# Patient Record
Sex: Female | Born: 1935 | Race: White | Hispanic: No | Marital: Single | Smoking: Former smoker
Health system: Southern US, Community
[De-identification: ages and names within clinical notes are randomized; demographics above are authoritative.]

## PROBLEM LIST (undated history)

## (undated) DIAGNOSIS — I498 Other specified cardiac arrhythmias: Secondary | ICD-10-CM

## (undated) DIAGNOSIS — K859 Acute pancreatitis without necrosis or infection, unspecified: Secondary | ICD-10-CM

## (undated) DIAGNOSIS — H3530 Unspecified macular degeneration: Secondary | ICD-10-CM

## (undated) DIAGNOSIS — H9190 Unspecified hearing loss, unspecified ear: Secondary | ICD-10-CM

## (undated) DIAGNOSIS — I10 Essential (primary) hypertension: Secondary | ICD-10-CM

## (undated) DIAGNOSIS — C801 Malignant (primary) neoplasm, unspecified: Secondary | ICD-10-CM

## (undated) HISTORY — PX: BREAST SURGERY: SHX581

## (undated) HISTORY — PX: TONSILLECTOMY: SUR1361

## (undated) HISTORY — DX: Other specified cardiac arrhythmias: I49.8

## (undated) HISTORY — PX: CHOLECYSTECTOMY: SHX55

---

## 2001-03-07 ENCOUNTER — Encounter: Payer: Self-pay | Admitting: Internal Medicine

## 2001-03-07 ENCOUNTER — Ambulatory Visit (HOSPITAL_COMMUNITY): Admission: RE | Admit: 2001-03-07 | Discharge: 2001-03-07 | Payer: Self-pay | Admitting: Internal Medicine

## 2001-03-11 ENCOUNTER — Encounter: Payer: Self-pay | Admitting: Internal Medicine

## 2001-03-11 ENCOUNTER — Ambulatory Visit (HOSPITAL_COMMUNITY): Admission: RE | Admit: 2001-03-11 | Discharge: 2001-03-11 | Payer: Self-pay | Admitting: Internal Medicine

## 2001-05-06 ENCOUNTER — Ambulatory Visit (HOSPITAL_COMMUNITY): Admission: RE | Admit: 2001-05-06 | Discharge: 2001-05-06 | Payer: Self-pay | Admitting: Internal Medicine

## 2001-05-06 ENCOUNTER — Encounter: Payer: Self-pay | Admitting: Internal Medicine

## 2001-12-16 ENCOUNTER — Ambulatory Visit (HOSPITAL_COMMUNITY): Admission: RE | Admit: 2001-12-16 | Discharge: 2001-12-16 | Payer: Self-pay | Admitting: Internal Medicine

## 2002-03-17 ENCOUNTER — Encounter: Payer: Self-pay | Admitting: Internal Medicine

## 2002-03-17 ENCOUNTER — Ambulatory Visit (HOSPITAL_COMMUNITY): Admission: RE | Admit: 2002-03-17 | Discharge: 2002-03-17 | Payer: Self-pay | Admitting: Internal Medicine

## 2002-03-27 ENCOUNTER — Ambulatory Visit (HOSPITAL_COMMUNITY): Admission: RE | Admit: 2002-03-27 | Discharge: 2002-03-27 | Payer: Self-pay | Admitting: Internal Medicine

## 2002-03-27 ENCOUNTER — Encounter: Payer: Self-pay | Admitting: Internal Medicine

## 2002-03-31 ENCOUNTER — Encounter: Payer: Self-pay | Admitting: General Surgery

## 2002-04-03 ENCOUNTER — Ambulatory Visit (HOSPITAL_COMMUNITY): Admission: RE | Admit: 2002-04-03 | Discharge: 2002-04-03 | Payer: Self-pay | Admitting: General Surgery

## 2002-04-03 ENCOUNTER — Encounter: Payer: Self-pay | Admitting: General Surgery

## 2002-04-10 ENCOUNTER — Inpatient Hospital Stay (HOSPITAL_COMMUNITY): Admission: RE | Admit: 2002-04-10 | Discharge: 2002-04-12 | Payer: Self-pay | Admitting: General Surgery

## 2002-05-09 ENCOUNTER — Encounter: Admission: RE | Admit: 2002-05-09 | Discharge: 2002-05-09 | Payer: Self-pay | Admitting: Oncology

## 2002-05-09 ENCOUNTER — Encounter (HOSPITAL_COMMUNITY): Admission: RE | Admit: 2002-05-09 | Discharge: 2002-06-08 | Payer: Self-pay | Admitting: Oncology

## 2002-05-15 ENCOUNTER — Ambulatory Visit (HOSPITAL_COMMUNITY): Admission: RE | Admit: 2002-05-15 | Discharge: 2002-05-15 | Payer: Self-pay | Admitting: Oncology

## 2002-05-15 ENCOUNTER — Encounter (HOSPITAL_COMMUNITY): Payer: Self-pay | Admitting: Oncology

## 2002-05-16 ENCOUNTER — Encounter (HOSPITAL_COMMUNITY): Payer: Self-pay | Admitting: Oncology

## 2002-06-21 ENCOUNTER — Ambulatory Visit (HOSPITAL_COMMUNITY): Admission: RE | Admit: 2002-06-21 | Discharge: 2002-06-21 | Payer: Self-pay | Admitting: Oncology

## 2002-06-21 ENCOUNTER — Encounter (HOSPITAL_COMMUNITY): Payer: Self-pay | Admitting: Oncology

## 2002-08-08 ENCOUNTER — Encounter (HOSPITAL_COMMUNITY): Admission: RE | Admit: 2002-08-08 | Discharge: 2002-09-07 | Payer: Self-pay | Admitting: Oncology

## 2002-08-08 ENCOUNTER — Encounter: Admission: RE | Admit: 2002-08-08 | Discharge: 2002-08-08 | Payer: Self-pay | Admitting: Oncology

## 2002-08-10 ENCOUNTER — Encounter (HOSPITAL_COMMUNITY): Payer: Self-pay | Admitting: Oncology

## 2002-11-21 ENCOUNTER — Encounter: Admission: RE | Admit: 2002-11-21 | Discharge: 2002-11-21 | Payer: Self-pay | Admitting: Oncology

## 2002-11-21 ENCOUNTER — Encounter (HOSPITAL_COMMUNITY): Admission: RE | Admit: 2002-11-21 | Discharge: 2002-12-21 | Payer: Self-pay | Admitting: Oncology

## 2003-04-11 ENCOUNTER — Encounter (HOSPITAL_COMMUNITY): Payer: Self-pay | Admitting: Oncology

## 2003-04-11 ENCOUNTER — Encounter (HOSPITAL_COMMUNITY): Admission: RE | Admit: 2003-04-11 | Discharge: 2003-05-11 | Payer: Self-pay | Admitting: Oncology

## 2003-04-11 ENCOUNTER — Encounter: Admission: RE | Admit: 2003-04-11 | Discharge: 2003-04-11 | Payer: Self-pay | Admitting: Oncology

## 2003-04-24 ENCOUNTER — Encounter: Payer: Self-pay | Admitting: Internal Medicine

## 2003-04-24 ENCOUNTER — Ambulatory Visit (HOSPITAL_COMMUNITY): Admission: RE | Admit: 2003-04-24 | Discharge: 2003-04-24 | Payer: Self-pay | Admitting: Internal Medicine

## 2003-06-08 ENCOUNTER — Encounter (HOSPITAL_COMMUNITY): Admission: RE | Admit: 2003-06-08 | Discharge: 2003-07-08 | Payer: Self-pay | Admitting: Oncology

## 2003-06-08 ENCOUNTER — Encounter: Admission: RE | Admit: 2003-06-08 | Discharge: 2003-06-08 | Payer: Self-pay | Admitting: Oncology

## 2003-12-10 ENCOUNTER — Encounter (HOSPITAL_COMMUNITY): Admission: RE | Admit: 2003-12-10 | Discharge: 2004-01-09 | Payer: Self-pay | Admitting: Oncology

## 2003-12-10 ENCOUNTER — Encounter: Admission: RE | Admit: 2003-12-10 | Discharge: 2003-12-10 | Payer: Self-pay | Admitting: Oncology

## 2004-05-06 ENCOUNTER — Ambulatory Visit (HOSPITAL_COMMUNITY): Admission: RE | Admit: 2004-05-06 | Discharge: 2004-05-06 | Payer: Self-pay | Admitting: Internal Medicine

## 2004-06-09 ENCOUNTER — Encounter: Admission: RE | Admit: 2004-06-09 | Discharge: 2004-06-09 | Payer: Self-pay | Admitting: Oncology

## 2004-06-09 ENCOUNTER — Encounter (HOSPITAL_COMMUNITY): Admission: RE | Admit: 2004-06-09 | Discharge: 2004-07-09 | Payer: Self-pay | Admitting: Oncology

## 2005-02-06 ENCOUNTER — Encounter (HOSPITAL_COMMUNITY): Admission: RE | Admit: 2005-02-06 | Discharge: 2005-03-08 | Payer: Self-pay | Admitting: Oncology

## 2005-02-06 ENCOUNTER — Ambulatory Visit (HOSPITAL_COMMUNITY): Payer: Self-pay | Admitting: Oncology

## 2005-02-06 ENCOUNTER — Encounter: Admission: RE | Admit: 2005-02-06 | Discharge: 2005-02-06 | Payer: Self-pay | Admitting: Oncology

## 2005-02-11 ENCOUNTER — Ambulatory Visit (HOSPITAL_COMMUNITY): Admission: RE | Admit: 2005-02-11 | Discharge: 2005-02-11 | Payer: Self-pay | Admitting: Internal Medicine

## 2005-05-04 ENCOUNTER — Ambulatory Visit (HOSPITAL_COMMUNITY): Admission: RE | Admit: 2005-05-04 | Discharge: 2005-05-04 | Payer: Self-pay | Admitting: Internal Medicine

## 2005-05-08 ENCOUNTER — Ambulatory Visit (HOSPITAL_COMMUNITY): Admission: RE | Admit: 2005-05-08 | Discharge: 2005-05-08 | Payer: Self-pay | Admitting: Internal Medicine

## 2005-10-07 ENCOUNTER — Ambulatory Visit (HOSPITAL_COMMUNITY): Payer: Self-pay | Admitting: Oncology

## 2005-10-07 ENCOUNTER — Encounter (HOSPITAL_COMMUNITY): Admission: RE | Admit: 2005-10-07 | Discharge: 2005-10-07 | Payer: Self-pay | Admitting: Oncology

## 2005-10-07 ENCOUNTER — Encounter: Admission: RE | Admit: 2005-10-07 | Discharge: 2005-10-07 | Payer: Self-pay | Admitting: Oncology

## 2006-04-09 ENCOUNTER — Encounter (HOSPITAL_COMMUNITY): Admission: RE | Admit: 2006-04-09 | Discharge: 2006-05-09 | Payer: Self-pay | Admitting: Oncology

## 2006-04-09 ENCOUNTER — Ambulatory Visit (HOSPITAL_COMMUNITY): Payer: Self-pay | Admitting: Oncology

## 2006-04-09 ENCOUNTER — Encounter: Admission: RE | Admit: 2006-04-09 | Discharge: 2006-04-09 | Payer: Self-pay | Admitting: Oncology

## 2006-05-11 ENCOUNTER — Ambulatory Visit (HOSPITAL_COMMUNITY): Admission: RE | Admit: 2006-05-11 | Discharge: 2006-05-11 | Payer: Self-pay | Admitting: Internal Medicine

## 2006-06-28 ENCOUNTER — Ambulatory Visit (HOSPITAL_COMMUNITY): Admission: RE | Admit: 2006-06-28 | Discharge: 2006-06-28 | Payer: Self-pay | Admitting: Ophthalmology

## 2006-09-13 ENCOUNTER — Ambulatory Visit (HOSPITAL_COMMUNITY): Admission: RE | Admit: 2006-09-13 | Discharge: 2006-09-13 | Payer: Self-pay | Admitting: Ophthalmology

## 2006-09-24 ENCOUNTER — Ambulatory Visit (HOSPITAL_COMMUNITY): Payer: Self-pay | Admitting: Oncology

## 2006-09-24 ENCOUNTER — Encounter (HOSPITAL_COMMUNITY): Admission: RE | Admit: 2006-09-24 | Discharge: 2006-10-11 | Payer: Self-pay | Admitting: Oncology

## 2007-02-25 ENCOUNTER — Ambulatory Visit (HOSPITAL_COMMUNITY): Payer: Self-pay | Admitting: Oncology

## 2007-02-25 ENCOUNTER — Encounter (HOSPITAL_COMMUNITY): Admission: RE | Admit: 2007-02-25 | Discharge: 2007-03-27 | Payer: Self-pay | Admitting: Oncology

## 2007-05-16 ENCOUNTER — Ambulatory Visit (HOSPITAL_COMMUNITY): Admission: RE | Admit: 2007-05-16 | Discharge: 2007-05-16 | Payer: Self-pay | Admitting: Internal Medicine

## 2008-02-22 ENCOUNTER — Encounter (HOSPITAL_COMMUNITY): Admission: RE | Admit: 2008-02-22 | Discharge: 2008-03-23 | Payer: Self-pay | Admitting: Oncology

## 2008-03-16 ENCOUNTER — Ambulatory Visit (HOSPITAL_COMMUNITY): Payer: Self-pay | Admitting: Oncology

## 2008-05-16 ENCOUNTER — Ambulatory Visit (HOSPITAL_COMMUNITY): Admission: RE | Admit: 2008-05-16 | Discharge: 2008-05-16 | Payer: Self-pay | Admitting: Internal Medicine

## 2008-10-31 ENCOUNTER — Ambulatory Visit (HOSPITAL_COMMUNITY): Admission: RE | Admit: 2008-10-31 | Discharge: 2008-10-31 | Payer: Self-pay | Admitting: Internal Medicine

## 2009-03-15 ENCOUNTER — Ambulatory Visit (HOSPITAL_COMMUNITY): Payer: Self-pay | Admitting: Oncology

## 2009-05-17 ENCOUNTER — Ambulatory Visit (HOSPITAL_COMMUNITY): Admission: RE | Admit: 2009-05-17 | Discharge: 2009-05-17 | Payer: Self-pay | Admitting: Internal Medicine

## 2010-03-14 ENCOUNTER — Ambulatory Visit (HOSPITAL_COMMUNITY): Payer: Self-pay | Admitting: Oncology

## 2010-05-03 ENCOUNTER — Encounter: Admission: RE | Admit: 2010-05-03 | Discharge: 2010-05-03 | Payer: Self-pay | Admitting: Internal Medicine

## 2010-05-20 ENCOUNTER — Ambulatory Visit (HOSPITAL_COMMUNITY): Admission: RE | Admit: 2010-05-20 | Discharge: 2010-05-20 | Payer: Self-pay | Admitting: Internal Medicine

## 2011-01-13 ENCOUNTER — Ambulatory Visit (HOSPITAL_BASED_OUTPATIENT_CLINIC_OR_DEPARTMENT_OTHER)
Admission: RE | Admit: 2011-01-13 | Discharge: 2011-01-13 | Disposition: A | Discharge status: Home / Self Care | Payer: Medicare FFS | Source: Ambulatory Visit | Attending: Orthopedic Surgery | Admitting: Orthopedic Surgery

## 2011-01-13 ENCOUNTER — Other Ambulatory Visit: Payer: Self-pay | Admitting: Orthopedic Surgery

## 2011-01-13 DIAGNOSIS — Z01812 Encounter for preprocedural laboratory examination: Secondary | ICD-10-CM | POA: Insufficient documentation

## 2011-01-13 DIAGNOSIS — M19049 Primary osteoarthritis, unspecified hand: Secondary | ICD-10-CM | POA: Insufficient documentation

## 2011-01-13 DIAGNOSIS — M6740 Ganglion, unspecified site: Secondary | ICD-10-CM | POA: Insufficient documentation

## 2011-01-13 LAB — POCT HEMOGLOBIN-HEMACUE: Hemoglobin: 14 g/dL (ref 12.0–15.0)

## 2011-02-27 NOTE — Op Note (Signed)
[facility]  Patient:    [name], [name] Visit Number: [id number] MRN: [id number]          Service Type: MED Location: 3A A310 01 Attending Physician:  Dalia Heading Dictated by:   Franky Macho, M.D. Proc. Date: 04/10/02 Admit Date:  04/10/2002   CC:         Carylon Perches, M.D.   Operative Report  PATIENT AGE:  77 years old.  PREOPERATIVE DIAGNOSIS:  Left breast carcinoma.  POSTOPERATIVE DIAGNOSIS:  Left breast carcinoma.  OPERATION:  Left modified radical mastectomy.  SURGEON:  Franky Macho, M.D.  ANESTHESIA:  General endotracheal anesthesia.  INDICATIONS:  The patient is an 77 year old white female who presents with invasive ductal carcinoma of the left breast.  The margins were not clear. After extensive discussion with the patient concerning all her options, she elected to proceed with a left modified radical mastectomy.  The risks and benefits of the procedure including bleeding, infection, and nerve injury were fully explained to the patient who gave informed consent.  DESCRIPTION OF PROCEDURE:  The patient was placed in the supine position.  The left breast and axilla were prepped and draped using the usual sterile technique with Betadine.  Surgical site confirmation was performed.  An elliptical incision was made medial and lateral around the nipple across the breast.  The superior and inferior flaps were then formed up to the left clavicle and to the chest wall.  The breast was then freed away along with its fascia from the pectoralis major muscle and chest wall using Bovie electrocautery medial to lateral.  Next, a level 2 axillary dissection was performed.  The long thoracic artery vein and nerve as well as long thoracic nerves were identified.  The intercostal nerve was identified and saved.  The axillary contents were removed along with the left breast without difficulty. Any bleeding was controlled using Bovie electrocautery  or small clips. Several swollen lymph nodes were noted to be palpated in the axillary contents.  Specimen was sent to pathology for further examination.  The wound was irrigated with normal saline.  A superior Jackson-Pratt drain was placed along the flap, and an inferior Jackson-Pratt drain was placed into the axilla.  Both were secured at the skin level using 3-0 nylon interrupted sutures.  The subcutaneous layer was reapproximated using a 3-0 Vicryl interrupted suture.  The skin was closed using staples.  Betadine ointment and dry sterile dressings were applied.  All tape and needle counts were correct at the end of the procedure.  The patient was extubated in the operating room and went back to the recovery room awake and in stable condition.  COMPLICATIONS:  None.  SPECIMEN:   Left breast and axilla.  ESTIMATED BLOOD LOSS:  100 cc. Dictated by:   Franky Macho, M.D. Attending Physician:  Dalia Heading DD:  04/10/02 TD:  04/11/02 Job: [id number]

## 2011-02-27 NOTE — H&P (Signed)
[facility]  Patient:    [name], [name] Visit Number: [id number] MRN: [id number]          Service Type: OUT Location: RAD Attending Physician:  Carylon Perches Dictated by:   Franky Macho, M.D. Admit Date:  03/27/2002 Discharge Date: 03/27/2002   CC:         Carylon Perches, M.D.   History and Physical  AGE:  77 years old.  CHIEF COMPLAINT:  Left breast mass, nonpalpable.  HISTORY OF PRESENT ILLNESS:  The patient is an 77 year old white female who is referred for evaluation and treatment of a left breast mass.  It is not palpable.  Seen on routine mammography.  Ultrasound is negative for a cyst. No nipple discharge or immediate family history of breast carcinoma is noted. She has never been pregnant.  She went through menopause in the early 79s.  PAST MEDICAL HISTORY:  Includes hypertension.  PAST SURGICAL HISTORY:  Cholecystectomy in 1995.  CURRENT MEDICATIONS: 1. Accupril 40 mg p.o. q.d. 2. Toprol XL 25 mg p.o. q.d. 3. Chlorthalidone 25 mg p.o. q.d. 4. Klor-Con 10 mg p.o. q.d. 5. Ecotrin one tablet p.o. q.d. 6. Centrum Silver one tablet p.o. q.d.  ALLERGIES:  No known drug allergies.  REVIEW OF SYSTEMS:  The patient denies tobacco use.  She does drink alcohol on occasion.  No recent chest pain, shortness of breath, leg swelling, CVA, or diabetes mellitus have been noted.  She denies any bleeding disorder.  PHYSICAL EXAMINATION  GENERAL:  The patient is a well-developed, well-nourished white female in no acute distress.  VITAL SIGNS:  She is afebrile.  Vital signs are stable.  NECK:  Supple without lymphadenopathy.  LUNGS:  Clear to auscultation with equal breath sounds bilaterally.  HEART:  Regular rate and rhythm without S3, S4, or murmurs.  BREASTS:  Right breast examination reveals no dominant mass, nipple discharge, or dimpling.  The axilla is negative for palpable nodes.  Left breast examination is unremarkable.  No dominant  mass, nipple discharge, or dimpling are noted.  The axilla is negative for palpable nodes.  LABORATORIES:  Mammography and ultrasound reports were reviewed.  An increased density is noted at the 3 oclock position in the left breast.  Ultrasound is negative for a cyst.  IMPRESSION:  Left breast mass, nonpalpable.  PLAN:  The patient is scheduled for a left breast biopsy after needle localization on April 05, 2002.  The risks and benefits of the procedure including bleeding and infection were fully explained to the patient.  Gave informed consent.  She was instructed to hold her aspirin. Dictated by:   Franky Macho, M.D. Attending Physician:  Carylon Perches DD:  03/28/02 TD:  03/29/02 Job: [id number]

## 2011-02-27 NOTE — H&P (Signed)
[facility]  Patient:    [name], [name] Visit Number: [id number] MRN: [id number]          Service Type: DSU Location: DAY Attending Physician:  Dalia Heading Dictated by:   Franky Macho, M.D. Admit Date:  04/10/2002   CC:         Carylon Perches, M.D.   History and Physical  AGE:  77 years old.  CHIEF COMPLAINT:  Left breast carcinoma.  HISTORY OF PRESENT ILLNESS:  The patient is an 77 year old white female who recently underwent a left breast biopsy after needle localization and was found to have a 1.4 cm invasive ductal carcinoma with the margins involved. After extensive discussion with the patient with her options, she elected to proceed with a modified radical mastectomy.  PAST MEDICAL HISTORY:  Hypertension.  PAST SURGICAL HISTORY:  As noted above, cholecystectomy in 1995.  CURRENT MEDICATIONS: 1. Accupril 40 mg p.o. daily. 2. Toprol XL 25 mg p.o. daily. 3. Chlorthalidone 25 mg p.o. daily. 4. Klor-Con 10 mEq p.o. daily. 5. Ecotrin one tablet p.o. daily which she is holding.  ALLERGIES:  No known drug allergies.  REVIEW OF SYSTEMS:  The patient denies tobacco use.  She drinks alcohol only on occasion.  She denies any other medical problems or bleeding disorders.  PHYSICAL EXAMINATION:  On physical examination, the patient is a well-developed, well-nourished, white female in no acute distress.  She is afebrile and vital signs are stable.  Neck is supple without lymphadenopathy. Lungs clear to auscultation with equal breath sounds bilaterally.  Heart examination reveals a regular rate and rhythm without S3, S4, or murmurs. Left breast examination reveals a healing surgical scar along the lateral aspect of the left breast.  No axillary lymph nodes are palpable.  IMPRESSION:  Left breast carcinoma.  PLAN:  The patient is scheduled for a left modified radical mastectomy on April 10, 2002.  The risks and benefits of the procedure  including bleeding, infection, and the possibility of nerve injury were fully explained to the patient, gave inform consent. Dictated by:   Franky Macho, M.D. Attending Physician:  Dalia Heading DD:  04/08/02 TD:  04/10/02 Job: [id number]

## 2011-02-27 NOTE — Op Note (Signed)
[facility]  Patient:    AMARACHI, KOTZ Visit Number: [id number] MRN: [id number]          Service Type: DSU Location: DAY Attending Physician:  Dalia Heading Dictated by:   Franky Macho, M.D. Proc. Date: 04/03/02 Admit Date:  04/03/2002   CC:         Carylon Perches, M.D.   Operative Report  AGE:  77 years old.  PREOPERATIVE DIAGNOSIS:  Left breast mass, nonpalpable.  POSTOPERATIVE DIAGNOSIS:  Left breast mass, nonpalpable.  PROCEDURE:  Left breast biopsy after needle localization.  SURGEON:  Franky Macho, M.D.  ANESTHESIA:  MAC.  INDICATIONS:  The patient is an 77 year old white female who was referred for an abnormal mammogram of the left breast.  The risks and benefits of the procedure including bleeding and infection were fully explained to the patient, who gave informed consent.  DESCRIPTION OF PROCEDURE:  The patient was placed in the supine position after undergoing needle localization in x-ray.  The left breast was prepped and draped using the usual sterile technique with Betadine.  Xylocaine 1% was used for local anesthesia.  An incision was made where the needle had been placed, which was in the lower, outer quadrant of the left breast.  This was taken down to the tip and a representative section of breast tissue was removed from the tip of the needle; this was sent to pathology for specimen radiography.  The abnormal area was within the specimen removed.  Any bleeding was controlled using Bovie electrocautery.  The skin was closed using a 4-0 Vicryl subcuticular suture.  Steri-Strips and a dry sterile dressing were applied.  All tape and needle counts were correct at the end of the procedure.  The patient was taken to the PACU in stable condition.  COMPLICATIONS:  None.  SPECIMEN:  Left breast biopsy.  BLOOD LOSS:  Minimal. Dictated by:   Franky Macho, M.D. Attending Physician:  Dalia Heading DD:   04/03/02 TD:  04/04/02 Job: [id number]

## 2011-02-27 NOTE — Discharge Summary (Signed)
[facility]  Patient:    [name], KLEVEN Visit Number: [id number] MRN: [id number]          Service Type: MED Location: 3A A310 01 Attending Physician:  Dalia Heading Dictated by:   Franky Macho, M.D. Admit Date:  04/10/2002 Discharge Date: 04/12/2002   CC:         Carylon Perches, M.D.   Discharge Summary  AGE:  77 years old.  HOSPITAL COURSE SUMMARY:  The patient is an 77 year old white female with diagnosis of invasive ductal carcinoma of the left breast who presented for left modified radical mastectomy. This was performed on April 10, 2002. She tolerated the procedure well. Her postoperative course was for the most part unremarkable. Her blood pressure was running on the low side, thus her Accupril and chlorthalidone were held. She was also noted to be hypokalemic preoperatively and this resolved with potassium supplementation. Final pathology is still pending. Her flap Jackson-Pratt drain was removed. She still has a Jackson-Pratt drain in the left axilla.  DISPOSITION:  The patient is being discharged home on postoperative day #2 in good improving condition.  FOLLOWUP:  The patient is to follow up with Dr. Franky Macho on April 18, 2002.  DISCHARGE MEDICATIONS:  She is to hold her Accupril and chlorthalidone. She is also to hold her aspirin.  DISCHARGE INSTRUCTIONS:  She is to drain and record her Jackson-Pratt bulb suction twice a day. A visiting nurse has been assigned to do wound checks and drain checks.  PRINCIPAL DIAGNOSES: 1. Left breast carcinoma. 2. Hypertension.  PRINCIPAL PROCEDURE:  Left modified radical mastectomy on April 10, 2002. Dictated by:   Franky Macho, M.D. Attending Physician:  Dalia Heading DD:  04/12/02 TD:  04/13/02 Job: [id number]

## 2011-02-27 NOTE — Procedures (Signed)
[name], [name]             ACCOUNT NO.:  [id number]   MEDICAL RECORD NO.:  [id number]          PATIENT TYPE:  OUT   LOCATION:                                FACILITY:  [facility]   PHYSICIAN:  Kingsley Callander. [name], MD       DATE OF BIRTH:  26-Jan-1917   DATE OF PROCEDURE:  05/04/2005  DATE OF DISCHARGE:                                    STRESS TEST   The patient exercised 3 minutes 18 seconds (18 seconds into stage II of the  Bruce protocol), attaining a maximal heart rate of 123 (91% of the age-  predicted maximal heart rate) at a workload of 4.6 METs and discontinued  exercise due to dyspnea.  There were no symptoms of chest pain.  There were  no arrhythmias.  EKG interpretation was limited by a chronic left bundle  branch block.  Myoview images are pending.   IMPRESSION:  Nondiagnostic exercise stress test, Myoview images pending.       ROF/MEDQ  D:  05/04/2005  T:  05/04/2005  Job:  [id number]

## 2011-03-13 ENCOUNTER — Ambulatory Visit (HOSPITAL_COMMUNITY): Payer: Self-pay

## 2011-04-01 ENCOUNTER — Encounter (HOSPITAL_COMMUNITY): Payer: Medicare FFS | Attending: Oncology | Admitting: Oncology

## 2011-04-01 DIAGNOSIS — C50919 Malignant neoplasm of unspecified site of unspecified female breast: Secondary | ICD-10-CM

## 2011-04-16 ENCOUNTER — Other Ambulatory Visit (HOSPITAL_COMMUNITY): Payer: Self-pay | Admitting: Oncology

## 2011-04-16 DIAGNOSIS — Z09 Encounter for follow-up examination after completed treatment for conditions other than malignant neoplasm: Secondary | ICD-10-CM

## 2011-05-01 NOTE — Op Note (Signed)
NAME:  [name], SORTINO             ACCOUNT NO.:  [id number]  MEDICAL RECORD NO.:  [id number]          PATIENT TYPE:  LOCATION:                                 FACILITY:  PHYSICIAN:  Cindee Salt, M.D.            DATE OF BIRTH:  DATE OF PROCEDURE:  01/13/2011 DATE OF DISCHARGE:                              OPERATIVE REPORT   PREOPERATIVE DIAGNOSIS:  Cyst left ring finger with degenerative arthritis, PIP joint.  POSTOPERATIVE DIAGNOSIS:  Cyst left ring finger with degenerative arthritis, PIP joint.  OPERATION:  Excision large cyst dorsal aspect on middle phalanx, debridement proximal interphalangeal joint left ring finger.  SURGEON:  Cindee Salt, MD  ASSISTANT:  Betha Loa, MD  ANESTHESIA:  Forearm-based IV regional with local infiltration.  ANESTHESIOLOGIST:  Dr. Jean Rosenthal.  HISTORY:  The patient is a 77 year old female with a large cyst over the entire dorsal aspect of the middle phalanx of her left ring finger.  The skin is almost totally translucent at this point in time.  She has significant degenerative changes at the PIP joint with the cyst involving down into the joint.  She has elected to have this surgically excised.  Pre, peri, and postoperative course have been discussed along with risks and complications.  She is aware that there is no guarantee with the surgery, possibility of infection, recurrence injury to arteries, nerves, tendons, incomplete relief of symptoms, dystrophy.  In the preoperative area, the patient is seen.  The extremity marked by both the patient and surgeon.  Antibiotic given.  PROCEDURE:  The patient was brought to the operating room where a forearm-based IV regional anesthetic was carried out without difficulty. She was prepped using ChloraPrep, supine position, left arm free.  A 3- minute dry time was allowed.  Time-out taken confirming the patient and procedure.  A metacarpal block was given with 0.25% Marcaine without epinephrine, 9 mL  was used.  After adequate anesthesia was afforded, a curvilinear incision was made over the middle phalanx, PIP joint of left ring finger carried down through subcutaneous tissue.  Bleeders were electrocauterized with bipolar.  A large cyst involving the entire dorsal aspect of the middle phalanx was then identified.  With blunt sharp dissection this was dissected free, followed down into the PIP joint, and an incision made between the central tendon and conjoined lateral band.  This allowed visualization of the joint.  A synovectomy was performed, debridement of osteophytes performed with a small rongeur.  Specimen was sent to Pathology.  The wound was irrigated.  The skin was then closed with interrupted 5-0 Vicryl Rapide sutures. Sterile compressive dressing splint to the dorsal aspect of the finger with pressure over the area of skin was applied. Deflation of the tourniquet, all fingers immediately pinked.  She was taken to the recovery room for observation in satisfactory condition. She will be discharged home to return to the [facility] in 1 week on Vicodin.          ______________________________ Cindee Salt, M.D.     GK/MEDQ  D:  01/13/2011  T:  01/14/2011  Job:  [id number]  Electronically Signed by Cindee Salt M.D. on 05/01/2011 09:06:25 AM

## 2011-05-22 ENCOUNTER — Ambulatory Visit (HOSPITAL_COMMUNITY)
Admission: RE | Admit: 2011-05-22 | Discharge: 2011-05-22 | Disposition: A | Discharge status: Home / Self Care | Payer: Medicare FFS | Source: Ambulatory Visit | Attending: Oncology | Admitting: Oncology

## 2011-05-22 DIAGNOSIS — Z09 Encounter for follow-up examination after completed treatment for conditions other than malignant neoplasm: Secondary | ICD-10-CM

## 2011-05-22 DIAGNOSIS — Z1231 Encounter for screening mammogram for malignant neoplasm of breast: Secondary | ICD-10-CM | POA: Insufficient documentation

## 2011-07-09 LAB — COMPREHENSIVE METABOLIC PANEL
AST: 20
Albumin: 3.9
BUN: 12
Chloride: 96
GFR calc non Af Amer: >60
Potassium: 3.7
Total Bilirubin: 0.7
Total Protein: 7.3

## 2011-07-09 LAB — CBC
Hemoglobin: 14.1
MCV: 92.4
RBC: 4.28
RDW: 14

## 2011-07-09 LAB — DIFFERENTIAL
Basophils Absolute: 0.1
Lymphs Abs: 2.1
Monocytes Relative: 8
Neutrophils Relative %: 58

## 2012-03-21 ENCOUNTER — Observation Stay (HOSPITAL_COMMUNITY)
Admission: EM | Admit: 2012-03-21 | Discharge: 2012-03-23 | Disposition: A | Discharge status: Home / Self Care | Payer: Medicare FFS | Attending: Internal Medicine | Admitting: Internal Medicine

## 2012-03-21 ENCOUNTER — Emergency Department (HOSPITAL_COMMUNITY): Payer: Medicare FFS

## 2012-03-21 ENCOUNTER — Encounter (HOSPITAL_COMMUNITY): Payer: Self-pay | Admitting: *Deleted

## 2012-03-21 DIAGNOSIS — E876 Hypokalemia: Secondary | ICD-10-CM | POA: Insufficient documentation

## 2012-03-21 DIAGNOSIS — R55 Syncope and collapse: Principal | ICD-10-CM | POA: Insufficient documentation

## 2012-03-21 DIAGNOSIS — Z7982 Long term (current) use of aspirin: Secondary | ICD-10-CM | POA: Insufficient documentation

## 2012-03-21 DIAGNOSIS — I10 Essential (primary) hypertension: Secondary | ICD-10-CM | POA: Insufficient documentation

## 2012-03-21 DIAGNOSIS — Z853 Personal history of malignant neoplasm of breast: Secondary | ICD-10-CM | POA: Insufficient documentation

## 2012-03-21 DIAGNOSIS — E871 Hypo-osmolality and hyponatremia: Secondary | ICD-10-CM | POA: Insufficient documentation

## 2012-03-21 DIAGNOSIS — Z79899 Other long term (current) drug therapy: Secondary | ICD-10-CM | POA: Insufficient documentation

## 2012-03-21 HISTORY — DX: Essential (primary) hypertension: I10

## 2012-03-21 LAB — DIFFERENTIAL
Basophils Relative: 1 % (ref 0–1)
Lymphocytes Relative: 17 % (ref 12–46)
Monocytes Relative: 7 % (ref 3–12)
Neutro Abs: 7 10*3/uL (ref 1.7–7.7)
Neutrophils Relative %: 75 % (ref 43–77)

## 2012-03-21 LAB — COMPREHENSIVE METABOLIC PANEL
AST: 14 U/L (ref 0–37)
Albumin: 3.8 g/dL (ref 3.5–5.2)
Alkaline Phosphatase: 80 U/L (ref 39–117)
BUN: 16 mg/dL (ref 6–23)
CO2: 28 mEq/L (ref 19–32)
Creatinine, Ser: 0.73 mg/dL (ref 0.50–1.10)
Potassium: 3.2 mEq/L — ABNORMAL LOW (ref 3.5–5.1)
Sodium: 131 mEq/L — ABNORMAL LOW (ref 135–145)
Total Bilirubin: 0.4 mg/dL (ref 0.3–1.2)

## 2012-03-21 LAB — CBC
Hemoglobin: 14.3 g/dL (ref 12.0–15.0)
MCH: 31.4 pg (ref 26.0–34.0)
MCHC: 34.7 g/dL (ref 30.0–36.0)
Platelets: 227 10*3/uL (ref 150–400)
WBC: 9.4 10*3/uL (ref 4.0–10.5)

## 2012-03-21 MED ORDER — ACETAMINOPHEN 650 MG RE SUPP: 650.0000 mg | Freq: Four times a day (QID) | RECTAL | Status: DC | PRN

## 2012-03-21 MED ORDER — AMLODIPINE-OLMESARTAN 5-40 MG PO TABS: 1.0000 | ORAL_TABLET | Freq: Every day | ORAL | Status: DC

## 2012-03-21 MED ORDER — ALUM & MAG HYDROXIDE-SIMETH 200-200-20 MG/5ML PO SUSP: 30.0000 mL | Freq: Four times a day (QID) | ORAL | Status: DC | PRN

## 2012-03-21 MED ORDER — AMLODIPINE BESYLATE 5 MG PO TABS
5.0000 mg | ORAL_TABLET | Freq: Every day | ORAL | Status: DC
  Administered 2012-03-22: 5 mg via ORAL
  Filled 2012-03-21: qty 1

## 2012-03-21 MED ORDER — SODIUM CHLORIDE 0.9 % IJ SOLN
INTRAMUSCULAR | Status: AC
  Filled 2012-03-21: qty 3

## 2012-03-21 MED ORDER — ALPRAZOLAM 0.25 MG PO TABS: 0.2500 mg | ORAL_TABLET | Freq: Every evening | ORAL | Status: DC | PRN

## 2012-03-21 MED ORDER — METOPROLOL SUCCINATE ER 25 MG PO TB24
25.0000 mg | ORAL_TABLET | Freq: Every day | ORAL | Status: DC
  Administered 2012-03-22: 25 mg via ORAL
  Filled 2012-03-21: qty 1

## 2012-03-21 MED ORDER — ASPIRIN EC 81 MG PO TBEC
81.0000 mg | DELAYED_RELEASE_TABLET | Freq: Every day | ORAL | Status: DC
  Administered 2012-03-21 – 2012-03-22 (×2): 81 mg via ORAL
  Filled 2012-03-21 (×2): qty 1

## 2012-03-21 MED ORDER — SODIUM CHLORIDE 0.9 % IV SOLN: INTRAVENOUS | Status: DC

## 2012-03-21 MED ORDER — GABAPENTIN 100 MG PO CAPS
200.0000 mg | ORAL_CAPSULE | Freq: Three times a day (TID) | ORAL | Status: DC
  Administered 2012-03-21 – 2012-03-22 (×5): 200 mg via ORAL
  Filled 2012-03-21 (×3): qty 2
  Filled 2012-03-21: qty 1
  Filled 2012-03-21 (×2): qty 2

## 2012-03-21 MED ORDER — CHLORTHALIDONE 25 MG PO TABS
25.0000 mg | ORAL_TABLET | Freq: Every day | ORAL | Status: DC
  Administered 2012-03-22: 25 mg via ORAL
  Filled 2012-03-21 (×3): qty 1

## 2012-03-21 MED ORDER — ACETAMINOPHEN 325 MG PO TABS
650.0000 mg | ORAL_TABLET | Freq: Four times a day (QID) | ORAL | Status: DC | PRN
  Administered 2012-03-22: 650 mg via ORAL
  Filled 2012-03-21: qty 2

## 2012-03-21 MED ORDER — IRBESARTAN 300 MG PO TABS
300.0000 mg | ORAL_TABLET | Freq: Every day | ORAL | Status: DC
  Administered 2012-03-22: 300 mg via ORAL
  Filled 2012-03-21: qty 1

## 2012-03-21 MED ORDER — ENOXAPARIN SODIUM 40 MG/0.4ML ~~LOC~~ SOLN
40.0000 mg | SUBCUTANEOUS | Status: DC
  Administered 2012-03-21 – 2012-03-22 (×2): 40 mg via SUBCUTANEOUS
  Filled 2012-03-21 (×2): qty 0.4

## 2012-03-21 MED ORDER — ONDANSETRON HCL 4 MG PO TABS: 4.0000 mg | ORAL_TABLET | Freq: Four times a day (QID) | ORAL | Status: DC | PRN

## 2012-03-21 MED ORDER — ONDANSETRON HCL 4 MG/2ML IJ SOLN: 4.0000 mg | Freq: Four times a day (QID) | INTRAMUSCULAR | Status: DC | PRN

## 2012-03-21 MED ORDER — SODIUM CHLORIDE 0.9 % IV SOLN
Freq: Once | INTRAVENOUS | Status: AC
  Administered 2012-03-21: 13:00:00 via INTRAVENOUS

## 2012-03-21 MED ORDER — GUAIFENESIN-DM 100-10 MG/5ML PO SYRP
5.0000 mL | ORAL_SOLUTION | ORAL | Status: DC | PRN
  Administered 2012-03-21 – 2012-03-23 (×2): 5 mL via ORAL
  Filled 2012-03-21 (×2): qty 5

## 2012-03-21 MED ORDER — POTASSIUM CHLORIDE CRYS ER 20 MEQ PO TBCR
40.0000 meq | EXTENDED_RELEASE_TABLET | Freq: Three times a day (TID) | ORAL | Status: DC
  Administered 2012-03-21 – 2012-03-22 (×5): 40 meq via ORAL
  Filled 2012-03-21 (×5): qty 2

## 2012-03-21 MED ORDER — SODIUM CHLORIDE 0.9 % IJ SOLN
INTRAMUSCULAR | Status: AC
  Administered 2012-03-21: 10 mL
  Filled 2012-03-21: qty 3

## 2012-03-21 NOTE — ED Notes (Signed)
Dr. [name] called ED and reports pt has been vomiting, feels clammy, and has had 2 syncopal episodes.  Dr. [name] gave orders and says will see pt in ED.  Notified Dr. Freida Busman.

## 2012-03-21 NOTE — ED Notes (Signed)
Pt fainted x 2 per family and has been vomiting. Syncopal episodes lasted a couple seconds. Pt also c/o headache for a couple days and dizziness this am.

## 2012-03-22 LAB — BASIC METABOLIC PANEL
BUN: 18 mg/dL (ref 6–23)
Chloride: 95 mEq/L — ABNORMAL LOW (ref 96–112)
GFR calc non Af Amer: 71 mL/min — ABNORMAL LOW (ref >90)
Glucose, Bld: 126 mg/dL — ABNORMAL HIGH (ref 70–99)
Potassium: 3.7 mEq/L (ref 3.5–5.1)

## 2012-03-22 NOTE — Care Management Note (Signed)
    Page 1 of 1   03/22/2012     11:42:52 AM   CARE MANAGEMENT NOTE 03/22/2012  Patient:  [name], [name]   Account Number:  [id number]  Date Initiated:  03/22/2012  Documentation initiated by:  Sharrie Rothman  Subjective/Objective Assessment:   Pt admitted from home with syncopal episodes. Pt lives alone and does have a lifeline in place. Pts nephew and niece also check on pt frequently. Pt has a sitter who comes out to see pt daily.     Action/Plan:   No other CM needs noted.   Anticipated DC Date:  03/23/2012   Anticipated DC Plan:  HOME/SELF CARE      DC Planning Services  CM consult      Choice offered to / List presented to:             Status of service:  Completed, signed off Medicare Important Message given?   (If response is "NO", the following Medicare IM given date fields will be blank) Date Medicare IM given:   Date Additional Medicare IM given:    Discharge Disposition:  HOME/SELF CARE  Per UR Regulation:    If discussed at Long Length of Stay Meetings, dates discussed:    Comments:  03/22/12 1142 Arlyss Queen, RN BSN CM

## 2012-03-22 NOTE — H&P (Signed)
[name], SAGEN NO.:  [id number]  MEDICAL RECORD NO.:  [id number]  LOCATION:  A338                          FACILITY:  [facility]  PHYSICIAN:  Kingsley Callander. [name], MD       DATE OF BIRTH:  [birth year]  DATE OF ADMISSION:  03/21/2012 DATE OF DISCHARGE:  LH                             HISTORY & PHYSICAL   CHIEF COMPLAINT:  Syncope.  HISTORY OF PRESENT ILLNESS:  This patient is a 78 year old white female, who presented to the emergency room after experiencing 2 syncopal episodes at home.  She had gotten up and gone into the kitchen and then felt lightheaded and then had awakened on the floor.  She had an episode of vomiting following that.  She had noted a right-sided headache over the last few days.  She had not experienced any head injury.  She has not had fever, abdominal pain, or diarrhea.  She subsequently had a second episode of syncope, which only lasted several seconds and was witnessed by a helper and she had another episode of vomiting as well. There was no hematemesis.  She was subsequently able to walk to the car to come to the emergency room and denied any problems with speech or weakness in arm or leg.  She did feel generally weak.  PAST MEDICAL HISTORY: 1. Left breast cancer status post left modified radical mastectomy in     2003. 2. Hypertension. 3. Cholecystectomy. 4. Tonsillectomy. 5. Lumbar disk disease.  MEDICATIONS: 1. Azor 5/40 mg daily. 2. Metoprolol-XL 25 mg daily. 3. Chlorthalidone 25 mg daily. 4. Potassium 20 mEq b.i.d. 5. Aspirin 81 mg daily.  ALLERGIES:  None.  SOCIAL HISTORY:  She is a former smoker.  She has a glass of wine daily.  FAMILY HISTORY:  Her father died after complications of prostate surgery.  Her mother died of old age and had hypertension.  A sister has orthostatic hypotension.  A sister had hypertension.  REVIEW OF SYSTEMS:  No chest pain, abdominal pain, or voiding symptoms.  PHYSICAL EXAMINATION:  GENERAL:   Alert and oriented. HEENT:  Head is atraumatic and normocephalic.  Eyes revealed no scleral icterus.  Pupils are unequal.  She has had cataract surgery changes. Oropharynx is unremarkable. NECK:  Supple with no JVD or thyromegaly.  No carotid bruits. LUNGS:  Clear. HEART:  Bradycardic in the mid 50s with no murmurs or gallops. ABDOMEN:  Soft, nondistended, and nontender with no hepatosplenomegaly. GU:  No CVA tenderness. EXTREMITIES:  No cyanosis, clubbing, or edema. NEURO:  Strength and sensation are intact.  Face is symmetric.  Speech is intact.  LYMPH NODES:  No cervical or supraclavicular enlargement.  LABORATORY DATA:  She is mildly hypokalemic and hyponatremic.  Renal function is normal.  LFTs are normal.  Blood counts are normal.  Chest x- ray reveals no acute infiltrate.  CT scan of the brain reveals no acute abnormality.  She appears to have had an old thalamic infarct and has small vessel changes.  IMPRESSION/PLAN: 1. Syncope.  This appears to likely have been vasovagal.  She will be     observed in a monitored setting overnight.  She will be treated  with IV fluids and antiemetics as needed. 2. Hypokalemia.  We will supplement potassium. 3. Hypertension.  Continue antihypertensive regimen. 4. History of breast cancer.  She has had no sign of recurrence.     Kingsley Callander. [name], MD     ROF/MEDQ  D:  03/22/2012  T:  03/22/2012  Job:  [id number]

## 2012-03-22 NOTE — Progress Notes (Signed)
UR chart review completed.  

## 2012-03-22 NOTE — Progress Notes (Signed)
NAMEMARTINIQUE, PIZZIMENTI NO.:  [id number]  MEDICAL RECORD NO.:  [id number]  LOCATION:                                 FACILITY:  PHYSICIAN:  Kingsley Callander. [name], MD       DATE OF BIRTH:  [birth year]  DATE OF PROCEDURE:  03/22/2012 DATE OF DISCHARGE:                                PROGRESS NOTE   SUBJECTIVE:  She has had no further vomiting overnight.  She has not yet been up to walk.  She states that she feels stronger.  OBJECTIVE:  VITAL SIGNS: This morning are normal. LUNGS: Clear. HEART: Regular with no murmurs. ABDOMEN: Soft, nondistended, and nontender with no hepatosplenomegaly. NEUROLOGICAL STATUS: Nonfocal and unchanged.  IMPRESSION/PLAN: 1. Syncope.  No recurrence.  Continue monitoring. 2. Vomiting, resolved. 3. Hypokalemia, serum potassium has normalized from 3.2 to 3.7 with     supplementation. 4. Hyponatremia, stable at 130. 5. Hypertension, controlled on 4-drug therapy.     Kingsley Callander. [name], MD     ROF/MEDQ  D:  03/22/2012  T:  03/22/2012  Job:  [id number]

## 2012-03-23 LAB — SEDIMENTATION RATE: Sed Rate: 33 mm/hr — ABNORMAL HIGH (ref 0–22)

## 2012-03-23 NOTE — Progress Notes (Signed)
IV removed, site WNL.  Pt given d/c instructions and no new medications order, home meds remain the same as prior admission.  Discussed home care with patient and discussed home medications, patient verbalizes understanding, teachback completed. F/U appointment in place, pt states they will keep appointment. Pt is stable at this time. Pt waiting on her ride to come transport her home.

## 2012-03-23 NOTE — Discharge Summary (Signed)
[name], [name] NO.:  [id number]  MEDICAL RECORD NO.:  [id number]  LOCATION:  A338                          FACILITY:  [facility]  PHYSICIAN:  Kingsley Callander. [name], MD       DATE OF BIRTH:  1916/12/11  DATE OF ADMISSION:  03/21/2012 DATE OF DISCHARGE:  LH                              DISCHARGE SUMMARY   DISCHARGE DIAGNOSES: 1. Syncope, likely vasovagal. 2. Hypertension. 3. Hypokalemia. 4. Hyponatremia. 5. History of left breast cancer.  HOSPITAL COURSE:  This patient is a 78 year old female who presented to the emergency room after 2 syncopal episodes at home.  She had associated vomiting as well.  She had noted a right-sided headache prior to the onset of these symptoms.  She underwent a CT scan of the brain, which revealed no evidence of acute abnormality.  She was shown to have an old thalamic infarct and small vessel changes.  She is treated for hypertension with Azor, metoprolol-XL, and chlorthalidone.  She was mildly hypokalemic at 3.2.  She was supplemented into the normal range.  She is neurologically stable.  She is initially weak but is now able to ambulate on her own.  She has had a persistent mild hyponatremia 130.  Her diet has been advanced, and she has tolerated this well without any further episodes of vomiting.  Her general condition is significantly improved.  She is stable for discharge on the 12th.  She will be seen in followup in my office in 1 week.  DISCHARGE MEDICATIONS: 1. Azor 5/40 mg daily. 2. Chlorthalidone 25 mg daily. 3. Metoprolol-XL 25 mg daily. 4. Potassium 20 mEq b.i.d. 5. Aspirin 81 mg daily.     Kingsley Callander. [name], MD     ROF/MEDQ  D:  03/23/2012  T:  03/23/2012  Job:  [id number]

## 2012-03-23 NOTE — Progress Notes (Signed)
Pt's family member her to transport pt home.  Pt taken in wheelchair by staff and family member to main entrance.

## 2012-03-23 NOTE — Progress Notes (Signed)
Pt does not want to take medications in hospital, wants to wait until she gets home to take meds.

## 2012-03-30 ENCOUNTER — Encounter (HOSPITAL_COMMUNITY): Payer: Medicare FFS | Attending: Oncology | Admitting: Oncology

## 2012-03-30 ENCOUNTER — Ambulatory Visit (HOSPITAL_COMMUNITY): Payer: 59 | Admitting: Oncology

## 2012-03-30 VITALS — BP 120/63 | HR 63 | Temp 96.8°F | Wt 143.5 lb

## 2012-03-30 DIAGNOSIS — M810 Age-related osteoporosis without current pathological fracture: Secondary | ICD-10-CM

## 2012-03-30 DIAGNOSIS — C50919 Malignant neoplasm of unspecified site of unspecified female breast: Secondary | ICD-10-CM

## 2012-03-30 DIAGNOSIS — Z853 Personal history of malignant neoplasm of breast: Secondary | ICD-10-CM

## 2012-03-30 DIAGNOSIS — I951 Orthostatic hypotension: Secondary | ICD-10-CM

## 2012-03-30 NOTE — Progress Notes (Signed)
This office note has been dictated.

## 2012-03-30 NOTE — Progress Notes (Signed)
CC:   [name]. [name], MD Dalia Heading, M.D.  DIAGNOSES: 1. Stage I (T1c N0 M0) left-sided adenocarcinoma of the breast, status     post left modified radical mastectomy for a 1.4-cm cancer, ER     positive at 90%, PR positive at 30% with 6 negative nodes, Ki-67     marker was high at 25%, however, but she had no metastatic disease     on chest x-ray or bone scan and I treated her with letrozole 2.5 mg     starting on 05/09/2002, ending after 5 years in 2008.  She is thus     far without recurrence. 2. Recent syncopal episodes x2, for which she was hospitalized on     03/21/2012 with no obvious reason at this time.  She remains     orthostatically hypotensive presently. 3. Macular degeneration with decreasing eyesight. 4. Chronic constipation. 5. Left shoulder arthritis. 6. Hypertension. 7. Tonsillectomy as a child. 8. Cholecystectomy in the past. 9. Osteoporosis on therapy. [name] told me the story about her syncope and she actually passed out within 30 minutes later, she states, after getting some help.  She passed out while vomiting.  That was not too surprising, but her 1st syncopal episode was the 1st she had ever had in her life and was not precipitated by diarrhea or anything else.  She stated that that morning she just did not feel exactly "right."  PHYSICAL EXAMINATION:  Vital Signs:  Today pulse is 56 to 60, but supine her pulse was 56, blood pressure 136/63.  Sitting her blood pressure is 129/64 with a pulse of 64 and standing her blood pressure is 109/55 with a pulse of 65.  Lymph:  She has no adenopathy.  Breasts:  The left chest wall is clear.  She does have a large ecchymosis over her left upper shoulder that is about 4 or 5 inches across.  She has an ecchymoses on right lower forearm where she had IVs, however.  Lungs:  Clear.  Heart: Does not reveal an S3 gallop.  I was not sure I heard a murmur whatsoever.  Abdomen:  Soft and nontender.  The right breast is  negative for masses.  Extremities:  She has no peripheral edema.  So I will get these results to Dr. [name].  I am wondering if she either just had a subclinical viral infection or possibly did she develop or is she developing mild autonomic dysfunction.  Other than that, I will see her in 1 year.    ______________________________ Ladona Horns. Mariel Sleet, MD ESN/MEDQ  D:  03/30/2012  T:  03/30/2012  Job:  [id number]

## 2012-03-30 NOTE — Patient Instructions (Addendum)
[name]  [id number] [birth year] Dr. Glenford Peers   [facility]  Discharge Instructions  RECOMMENDATIONS MADE BY THE CONSULTANT AND ANY TEST RESULTS WILL BE SENT TO YOUR REFERRING DOCTOR.   EXAM FINDINGS BY MD TODAY AND SIGNS AND SYMPTOMS TO REPORT TO CLINIC OR PRIMARY MD: your blood pressure drops when you move from a lying to sitting to standing position.  You need to get your balance before you start walking.  When you see Dr. [name] tomorrow let him know about your BP and Heart Rate changes as below:  Lying your blood pressure and pulse were:       136/63 HR 56 Sitting your blood pressure and pulse were:      129/64 HR 64 Standing your blood pressure and pulse were:  109/55 HR 65  MEDICATIONS PRESCRIBED: none   INSTRUCTIONS GIVEN AND DISCUSSED: Other Report any new lumps, bone pain or shortness of breath.  SPECIAL INSTRUCTIONS/FOLLOW-UP: Return to Clinic in 1 year to see MD.   I acknowledge that I have been informed and understand all the instructions given to me and received a copy. I do not have any more questions at this time, but understand that I may call the Specialty Clinic at [facility] at [phone] during business hours should I have any further questions or need assistance in obtaining follow-up care.    __________________________________________  _____________  __________ Signature of Patient or Authorized Representative            Date                   Time    __________________________________________ Nurse's Signature

## 2012-05-13 ENCOUNTER — Other Ambulatory Visit (HOSPITAL_COMMUNITY): Payer: Self-pay | Admitting: Internal Medicine

## 2012-05-13 DIAGNOSIS — IMO0001 Reserved for inherently not codable concepts without codable children: Secondary | ICD-10-CM

## 2012-05-31 ENCOUNTER — Ambulatory Visit (HOSPITAL_COMMUNITY)
Admission: RE | Admit: 2012-05-31 | Discharge: 2012-05-31 | Disposition: A | Discharge status: Home / Self Care | Payer: Medicare FFS | Source: Ambulatory Visit | Attending: Internal Medicine | Admitting: Internal Medicine

## 2012-05-31 ENCOUNTER — Other Ambulatory Visit (HOSPITAL_COMMUNITY): Payer: Self-pay | Admitting: Internal Medicine

## 2012-05-31 DIAGNOSIS — Z853 Personal history of malignant neoplasm of breast: Secondary | ICD-10-CM | POA: Insufficient documentation

## 2012-05-31 DIAGNOSIS — Z1231 Encounter for screening mammogram for malignant neoplasm of breast: Secondary | ICD-10-CM | POA: Insufficient documentation

## 2012-05-31 DIAGNOSIS — IMO0001 Reserved for inherently not codable concepts without codable children: Secondary | ICD-10-CM

## 2012-09-13 ENCOUNTER — Other Ambulatory Visit (HOSPITAL_COMMUNITY): Payer: Self-pay | Admitting: Internal Medicine

## 2012-09-13 ENCOUNTER — Ambulatory Visit (HOSPITAL_COMMUNITY)
Admission: RE | Admit: 2012-09-13 | Discharge: 2012-09-13 | Disposition: A | Discharge status: Home / Self Care | Payer: Medicare FFS | Source: Ambulatory Visit | Attending: Internal Medicine | Admitting: Internal Medicine

## 2012-09-13 DIAGNOSIS — R0602 Shortness of breath: Secondary | ICD-10-CM

## 2012-09-13 DIAGNOSIS — J4489 Other specified chronic obstructive pulmonary disease: Secondary | ICD-10-CM | POA: Insufficient documentation

## 2012-09-13 DIAGNOSIS — J449 Chronic obstructive pulmonary disease, unspecified: Secondary | ICD-10-CM | POA: Insufficient documentation

## 2012-09-13 DIAGNOSIS — C50919 Malignant neoplasm of unspecified site of unspecified female breast: Secondary | ICD-10-CM | POA: Insufficient documentation

## 2012-09-15 ENCOUNTER — Other Ambulatory Visit (HOSPITAL_COMMUNITY): Payer: Self-pay | Admitting: Family Medicine

## 2012-09-15 ENCOUNTER — Other Ambulatory Visit (HOSPITAL_COMMUNITY): Payer: Self-pay | Admitting: Internal Medicine

## 2012-09-15 DIAGNOSIS — R0602 Shortness of breath: Secondary | ICD-10-CM

## 2012-09-20 ENCOUNTER — Ambulatory Visit (HOSPITAL_COMMUNITY)
Admission: RE | Admit: 2012-09-20 | Discharge: 2012-09-20 | Disposition: A | Discharge status: Home / Self Care | Payer: Medicare FFS | Source: Ambulatory Visit | Attending: Internal Medicine | Admitting: Internal Medicine

## 2012-09-20 DIAGNOSIS — R0602 Shortness of breath: Secondary | ICD-10-CM

## 2012-09-20 DIAGNOSIS — R911 Solitary pulmonary nodule: Secondary | ICD-10-CM | POA: Insufficient documentation

## 2012-09-20 MED ORDER — IOHEXOL 350 MG/ML SOLN
100.0000 mL | Freq: Once | INTRAVENOUS | Status: AC | PRN
  Administered 2012-09-20: 100 mL via INTRAVENOUS

## 2012-10-18 ENCOUNTER — Ambulatory Visit (HOSPITAL_COMMUNITY)
Admission: RE | Admit: 2012-10-18 | Discharge: 2012-10-18 | Disposition: A | Discharge status: Home / Self Care | Payer: Medicare FFS | Source: Ambulatory Visit | Attending: Internal Medicine | Admitting: Internal Medicine

## 2012-10-18 DIAGNOSIS — I059 Rheumatic mitral valve disease, unspecified: Secondary | ICD-10-CM

## 2012-10-18 DIAGNOSIS — I4891 Unspecified atrial fibrillation: Secondary | ICD-10-CM | POA: Insufficient documentation

## 2012-10-18 NOTE — Progress Notes (Signed)
*  PRELIMINARY RESULTS* Echocardiogram 2D Echocardiogram has been performed.  Conrad O'Kean 10/18/2012, 2:46 PM

## 2013-03-29 ENCOUNTER — Ambulatory Visit (HOSPITAL_COMMUNITY): Payer: Medicare FFS

## 2013-04-20 ENCOUNTER — Ambulatory Visit (HOSPITAL_COMMUNITY): Payer: Medicare FFS | Admitting: Oncology

## 2013-05-02 ENCOUNTER — Other Ambulatory Visit (HOSPITAL_COMMUNITY): Payer: Self-pay | Admitting: Internal Medicine

## 2013-05-02 ENCOUNTER — Encounter (HOSPITAL_COMMUNITY): Payer: Self-pay

## 2013-05-02 ENCOUNTER — Encounter (HOSPITAL_COMMUNITY): Payer: Medicare FFS | Attending: Hematology and Oncology

## 2013-05-02 VITALS — BP 120/69 | HR 59 | Temp 97.4°F | Resp 16 | Wt 138.6 lb

## 2013-05-02 DIAGNOSIS — C50919 Malignant neoplasm of unspecified site of unspecified female breast: Secondary | ICD-10-CM

## 2013-05-02 DIAGNOSIS — Z139 Encounter for screening, unspecified: Secondary | ICD-10-CM

## 2013-05-02 DIAGNOSIS — C50912 Malignant neoplasm of unspecified site of left female breast: Secondary | ICD-10-CM

## 2013-05-02 NOTE — Progress Notes (Signed)
[facility] [phone]   [phone]  OFFICE PROGRESS NOTE  Carylon Perches, MD [address] [place] [address]  DIAGNOSIS:Stage I (T1c N0 M0) left-sided adenocarcinoma of the breast   ONCOLOGIC HISTORY: Per Dr Mariel Sleet; Stage I (T1c N0 M0) left-sided adenocarcinoma of the breast, status post left modified radical mastectomy for a 1.4-cm cancer, ER  positive at 90%, PR positive at 30% with 6 negative nodes, Ki-67 marker was high at 25%, however, but she had no metastatic disease  on chest x-ray or bone scan and I treated her with letrozole 2.5 mg starting on 05/09/2002, ending after 5 years in 2008. She is thus far without recurrence.  INTERVAL HISTORY:   [name] 79 y.o. female returns to the clinic today for scheduled yearly follow up. She denies any new problems except for a recent in 'fluttering of the heart' which is being addressed by her care providers.  She has lost about 5 pounds since I one year ago but states that her appetite is fair.  She denies shortness of breath.  She tells me that she has a bone pains occasional and things that she may have had a DEXA scan last one to 2 years with her primary physician.  She reports that her mammogram is scheduled to later  next month. Mammogram of right breast 05/2012  was read as Birads 1 negative. She does not report any new breast mass.   MEDICAL HISTORY: Past Medical History  Diagnosis Date  . Hypertension   . Fluttering heart     ALLERGIES:  has No Known Allergies.  MEDICATIONS:  Current Outpatient Prescriptions  Medication Sig Dispense Refill  . Acetaminophen (TYLENOL EXTRA STRENGTH PO) Take 2 tablets by mouth as needed.      Marland Kitchen amLODipine-olmesartan (AZOR) 5-40 MG per tablet Take 1 tablet by mouth daily.      Marland Kitchen Apixaban (ELIQUIS PO) Take 1 tablet by mouth 2 (two) times daily.      . digoxin (LANOXIN) 0.125 MG tablet Take 0.125 mg by mouth.      .  furosemide (LASIX) 20 MG tablet Take 20 mg by mouth daily.      Marland Kitchen gabapentin (NEURONTIN) 100 MG capsule Take 200 mg by mouth 3 (three) times daily.      . metoprolol succinate (TOPROL-XL) 25 MG 24 hr tablet Take 25 mg by mouth daily.      . Multiple Vitamins-Minerals (PRESERVISION/LUTEIN) CAPS Take 1 capsule by mouth 2 (two) times daily.      . potassium chloride SA (K-DUR,KLOR-CON) 20 MEQ tablet Take 20 mEq by mouth 2 (two) times daily.       No current facility-administered medications for this visit.    SURGICAL HISTORY:  Past Surgical History  Procedure Laterality Date  . Cholecystectomy    . Breast surgery    . Tonsillectomy       REVIEW OF SYSTEMS: 14 point review of system is as in the history above otherwise negative.  PHYSICAL EXAMINATION:  Blood pressure 120/69, pulse 59, temperature 97.4 F (36.3 C), resp. rate 16, weight 138 lb 9.6 oz (62.869 kg). GENERAL: No distress.elderly frail woman. SKIN:  No rashes or significant lesions .  Bruise noted on the right shin( from injury) HEAD: Normocephalic, No masses, lesions, tenderness or abnormalities  LYMPH: No palpable lymphadenopathy, in the neck,  supraclavicular area or axilla. BREAST:well healed  left mastectomy scar without evidence of local recurrence.  Right breast is unremarkable for any palpable mass lesion. LUNGS: clear to auscultation , no crackles or wheezes HEART: regular rate & rhythm, no murmurs, no gallops, S1 normal and S2 normal . ABDOMEN: Abdomen soft, non-tender, normal bowel sounds, no masses or organomegaly and no hepatosplenomegaly palpable MSK: No CVA tenderness and no tenderness on percussion of the back or rib cage. EXTREMITIES: No edema, no skin discoloration or tenderness NEURO: alert & oriented , no focal motor/sensory deficits.     LABORATORY DATA: Lab Results  Component Value Date   WBC 9.4 03/21/2012   HGB 14.3 03/21/2012   HCT 41.2 03/21/2012   MCV 90.4 03/21/2012   PLT 227 03/21/2012       Chemistry      Component Value Date/Time   NA 130* 03/22/2012 0559   K 3.7 03/22/2012 0559   CL 95* 03/22/2012 0559   CO2 25 03/22/2012 0559   BUN 18 03/22/2012 0559   CREATININE 0.77 03/22/2012 0559      Component Value Date/Time   CALCIUM 8.5 03/22/2012 0559   ALKPHOS 80 03/21/2012 1259   AST 14 03/21/2012 1259   ALT 9 03/21/2012 1259   BILITOT 0.4 03/21/2012 1259       RADIOGRAPHIC STUDIES: No results found.   ASSESSMENT:  1.  Patient has no evidence of disease as regards her breast cancer.  At this stage in the course of her disease and age I doubt if routine yearly mammogram would be helpful.  2.  Giving her age, race and prior use of aromatase inhibitor I think that's osteoporosis is a major concern.  I don't see any recent DEXA scan despite patient history of having a recent one even though she  is not sure exactly when she did.   PLAN:  1. I recommended DEXA scan if none has been done in the last 2 years. Patient prefers to check with her primary Dr. On this and to have him do one if indicated. 2. We talked about routine yearly mammogram and given her age I recommended against it.  However her we will leave this to the discretion of the patient. 3. She will return to clinic in one year.     All questions were satisfactorily answered. Patient knows to call if  any concern arises.  I spent more than 50 % counseling the patient face to face. The total time spent in the appointment was 30 minutes.   Sherral Hammers, MD FACP. Hematology/Oncology.

## 2013-05-02 NOTE — Patient Instructions (Addendum)
[facility] Discharge Instructions  RECOMMENDATIONS MADE BY THE CONSULTANT AND ANY TEST RESULTS WILL BE SENT TO YOUR REFERRING PHYSICIAN.  EXAM FINDINGS BY THE PHYSICIAN TODAY AND SIGNS OR SYMPTOMS TO REPORT TO CLINIC OR PRIMARY PHYSICIAN: Exam and discussion by Dr. Sharia Reeve.  No evidence of recurrence by exam.  Mammography not really necessary at this time.  It's up to you whether or not you have one done.  MD does recommend that you have a Bone Density performed to make sure your bones are ok.  MEDICATIONS PRESCRIBED:  none  INSTRUCTIONS GIVEN AND DISCUSSED: Report any new lumps, bone pain, shortness of breath or other symptoms.  SPECIAL INSTRUCTIONS/FOLLOW-UP: Follow-up in 1 year.  Thank you for choosing [facility] to provide your oncology and hematology care.  To afford each patient quality time with our providers, please arrive at least 15 minutes before your scheduled appointment time.  With your help, our goal is to use those 15 minutes to complete the necessary work-up to ensure our physicians have the information they need to help with your evaluation and healthcare recommendations.    Effective January 1st, 2014, we ask that you re-schedule your appointment with our physicians should you arrive 10 or more minutes late for your appointment.  We strive to give you quality time with our providers, and arriving late affects you and other patients whose appointments are after yours.    Again, thank you for choosing [facility].  Our hope is that these requests will decrease the amount of time that you wait before being seen by our physicians.       _____________________________________________________________  Should you have questions after your visit to [facility], please contact our office at [phone] between the hours of 8:30 a.m. and 5:00 p.m.  Voicemails left after 4:30 p.m. will not be returned until the following  business day.  For prescription refill requests, have your pharmacy contact our office with your prescription refill request.

## 2013-06-05 ENCOUNTER — Ambulatory Visit (HOSPITAL_COMMUNITY): Payer: Medicare FFS

## 2013-06-06 ENCOUNTER — Other Ambulatory Visit (HOSPITAL_COMMUNITY): Payer: Medicare FFS

## 2013-06-20 ENCOUNTER — Ambulatory Visit (HOSPITAL_COMMUNITY)
Admission: RE | Admit: 2013-06-20 | Discharge: 2013-06-20 | Disposition: A | Discharge status: Home / Self Care | Payer: Medicare FFS | Source: Ambulatory Visit | Attending: Internal Medicine | Admitting: Internal Medicine

## 2013-06-20 DIAGNOSIS — I10 Essential (primary) hypertension: Secondary | ICD-10-CM | POA: Insufficient documentation

## 2013-06-20 DIAGNOSIS — I4892 Unspecified atrial flutter: Secondary | ICD-10-CM | POA: Insufficient documentation

## 2013-06-20 DIAGNOSIS — I369 Nonrheumatic tricuspid valve disorder, unspecified: Secondary | ICD-10-CM

## 2013-06-20 NOTE — Progress Notes (Signed)
*  PRELIMINARY RESULTS* Echocardiogram 2D Echocardiogram has been performed.  [name] 06/20/2013, 1:32 PM

## 2013-10-24 ENCOUNTER — Ambulatory Visit (HOSPITAL_COMMUNITY)
Admission: RE | Admit: 2013-10-24 | Discharge: 2013-10-24 | Disposition: A | Discharge status: Home / Self Care | Payer: Medicare FFS | Source: Ambulatory Visit | Attending: Internal Medicine | Admitting: Internal Medicine

## 2013-10-24 ENCOUNTER — Other Ambulatory Visit (HOSPITAL_COMMUNITY): Payer: Self-pay | Admitting: Internal Medicine

## 2013-10-24 ENCOUNTER — Observation Stay (HOSPITAL_COMMUNITY)
Admission: AD | Admit: 2013-10-24 | Discharge: 2013-10-27 | Disposition: A | Discharge status: Home / Self Care | Payer: Medicare FFS | Source: Ambulatory Visit | Attending: Internal Medicine | Admitting: Internal Medicine

## 2013-10-24 ENCOUNTER — Encounter (HOSPITAL_COMMUNITY): Payer: Self-pay | Admitting: General Practice

## 2013-10-24 ENCOUNTER — Other Ambulatory Visit: Payer: Self-pay | Admitting: Internal Medicine

## 2013-10-24 DIAGNOSIS — R059 Cough, unspecified: Secondary | ICD-10-CM

## 2013-10-24 DIAGNOSIS — J449 Chronic obstructive pulmonary disease, unspecified: Secondary | ICD-10-CM | POA: Insufficient documentation

## 2013-10-24 DIAGNOSIS — J111 Influenza due to unidentified influenza virus with other respiratory manifestations: Principal | ICD-10-CM | POA: Insufficient documentation

## 2013-10-24 DIAGNOSIS — I10 Essential (primary) hypertension: Secondary | ICD-10-CM | POA: Insufficient documentation

## 2013-10-24 DIAGNOSIS — I5022 Chronic systolic (congestive) heart failure: Secondary | ICD-10-CM | POA: Insufficient documentation

## 2013-10-24 DIAGNOSIS — M4800 Spinal stenosis, site unspecified: Secondary | ICD-10-CM | POA: Insufficient documentation

## 2013-10-24 DIAGNOSIS — I509 Heart failure, unspecified: Secondary | ICD-10-CM | POA: Insufficient documentation

## 2013-10-24 DIAGNOSIS — R509 Fever, unspecified: Secondary | ICD-10-CM

## 2013-10-24 DIAGNOSIS — H3530 Unspecified macular degeneration: Secondary | ICD-10-CM | POA: Insufficient documentation

## 2013-10-24 DIAGNOSIS — I4891 Unspecified atrial fibrillation: Secondary | ICD-10-CM | POA: Insufficient documentation

## 2013-10-24 DIAGNOSIS — R05 Cough: Secondary | ICD-10-CM

## 2013-10-24 DIAGNOSIS — J4489 Other specified chronic obstructive pulmonary disease: Secondary | ICD-10-CM | POA: Insufficient documentation

## 2013-10-24 LAB — COMPREHENSIVE METABOLIC PANEL
ALT: 12 U/L (ref 0–35)
AST: 18 U/L (ref 0–37)
Albumin: 3.8 g/dL (ref 3.5–5.2)
Alkaline Phosphatase: 79 U/L (ref 39–117)
BUN: 9 mg/dL (ref 6–23)
CO2: 27 meq/L (ref 19–32)
CREATININE: 0.64 mg/dL (ref 0.50–1.10)
Calcium: 8.8 mg/dL (ref 8.4–10.5)
Chloride: 95 mEq/L — ABNORMAL LOW (ref 96–112)
GFR, EST AFRICAN AMERICAN: 86 mL/min — AB (ref >90)
GFR, EST NON AFRICAN AMERICAN: 74 mL/min — AB (ref >90)
GLUCOSE: 155 mg/dL — AB (ref 70–99)
Potassium: 3.2 mEq/L — ABNORMAL LOW (ref 3.7–5.3)
SODIUM: 136 meq/L — AB (ref 137–147)
TOTAL PROTEIN: 7.7 g/dL (ref 6.0–8.3)
Total Bilirubin: 0.5 mg/dL (ref 0.3–1.2)

## 2013-10-24 LAB — INFLUENZA PANEL BY PCR (TYPE A & B)
H1N1FLUPCR: NOT DETECTED
Influenza A By PCR: POSITIVE — AB
Influenza B By PCR: NEGATIVE

## 2013-10-24 LAB — CBC WITH DIFFERENTIAL/PLATELET
Basophils Absolute: 0.1 10*3/uL (ref 0.0–0.1)
Basophils Relative: 1 % (ref 0–1)
EOS ABS: 0 10*3/uL (ref 0.0–0.7)
Eosinophils Relative: 0 % (ref 0–5)
HCT: 39.1 % (ref 36.0–46.0)
Hemoglobin: 13.8 g/dL (ref 12.0–15.0)
LYMPHS ABS: 0.4 10*3/uL — AB (ref 0.7–4.0)
LYMPHS PCT: 7 % — AB (ref 12–46)
MCH: 33.6 pg (ref 26.0–34.0)
MCHC: 35.3 g/dL (ref 30.0–36.0)
MCV: 95.1 fL (ref 78.0–100.0)
MONO ABS: 1 10*3/uL (ref 0.1–1.0)
Monocytes Relative: 15 % — ABNORMAL HIGH (ref 3–12)
Neutro Abs: 4.9 10*3/uL (ref 1.7–7.7)
Neutrophils Relative %: 77 % (ref 43–77)
PLATELETS: 171 10*3/uL (ref 150–400)
RBC: 4.11 MIL/uL (ref 3.87–5.11)
RDW: 13.4 % (ref 11.5–15.5)
WBC: 6.4 10*3/uL (ref 4.0–10.5)

## 2013-10-24 LAB — DIGOXIN LEVEL: Digoxin Level: 0.6 ng/mL — ABNORMAL LOW (ref 0.8–2.0)

## 2013-10-24 MED ORDER — SODIUM CHLORIDE 0.9 % IJ SOLN
3.0000 mL | Freq: Two times a day (BID) | INTRAMUSCULAR | Status: DC
  Administered 2013-10-24 – 2013-10-25 (×3): 3 mL via INTRAVENOUS

## 2013-10-24 MED ORDER — GUAIFENESIN-DM 100-10 MG/5ML PO SYRP: 5.0000 mL | ORAL_SOLUTION | ORAL | Status: DC | PRN

## 2013-10-24 MED ORDER — AMLODIPINE-OLMESARTAN 5-40 MG PO TABS: 1.0000 | ORAL_TABLET | Freq: Every day | ORAL | Status: DC

## 2013-10-24 MED ORDER — IRBESARTAN 300 MG PO TABS
300.0000 mg | ORAL_TABLET | Freq: Every day | ORAL | Status: DC
  Administered 2013-10-24 – 2013-10-26 (×3): 300 mg via ORAL
  Filled 2013-10-24 (×3): qty 1

## 2013-10-24 MED ORDER — HYDROCODONE-HOMATROPINE 5-1.5 MG/5ML PO SYRP
5.0000 mL | ORAL_SOLUTION | ORAL | Status: DC | PRN
  Administered 2013-10-25: 5 mL via ORAL
  Filled 2013-10-24: qty 5

## 2013-10-24 MED ORDER — ONDANSETRON HCL 4 MG/2ML IJ SOLN: 4.0000 mg | Freq: Four times a day (QID) | INTRAMUSCULAR | Status: DC | PRN

## 2013-10-24 MED ORDER — ACETAMINOPHEN 325 MG PO TABS
650.0000 mg | ORAL_TABLET | Freq: Four times a day (QID) | ORAL | Status: DC | PRN
  Administered 2013-10-24: 650 mg via ORAL
  Filled 2013-10-24: qty 2

## 2013-10-24 MED ORDER — ZOLPIDEM TARTRATE 5 MG PO TABS
2.5000 mg | ORAL_TABLET | Freq: Every evening | ORAL | Status: DC | PRN
Start: 2013-10-24 — End: 2013-10-27

## 2013-10-24 MED ORDER — OSELTAMIVIR PHOSPHATE 75 MG PO CAPS
75.0000 mg | ORAL_CAPSULE | Freq: Two times a day (BID) | ORAL | Status: DC
  Administered 2013-10-24 – 2013-10-26 (×5): 75 mg via ORAL
  Filled 2013-10-24 (×5): qty 1

## 2013-10-24 MED ORDER — ONDANSETRON HCL 4 MG PO TABS: 4.0000 mg | ORAL_TABLET | Freq: Four times a day (QID) | ORAL | Status: DC | PRN

## 2013-10-24 MED ORDER — ACETAMINOPHEN 650 MG RE SUPP: 650.0000 mg | Freq: Four times a day (QID) | RECTAL | Status: DC | PRN

## 2013-10-24 MED ORDER — METOPROLOL TARTRATE 50 MG PO TABS: 50.0000 mg | ORAL_TABLET | Freq: Two times a day (BID) | ORAL | Status: DC

## 2013-10-24 MED ORDER — DIGOXIN 125 MCG PO TABS
0.1250 mg | ORAL_TABLET | Freq: Every day | ORAL | Status: DC
  Administered 2013-10-24 – 2013-10-26 (×3): 0.125 mg via ORAL
  Filled 2013-10-24 (×3): qty 1

## 2013-10-24 MED ORDER — SODIUM CHLORIDE 0.9 % IJ SOLN: 3.0000 mL | INTRAMUSCULAR | Status: DC | PRN

## 2013-10-24 MED ORDER — APIXABAN 5 MG PO TABS: 5.0000 mg | ORAL_TABLET | Freq: Two times a day (BID) | ORAL | Status: DC

## 2013-10-24 MED ORDER — APIXABAN 5 MG PO TABS
5.0000 mg | ORAL_TABLET | Freq: Two times a day (BID) | ORAL | Status: DC
  Administered 2013-10-24 – 2013-10-26 (×6): 5 mg via ORAL
  Filled 2013-10-24 (×9): qty 1

## 2013-10-24 MED ORDER — ALUM & MAG HYDROXIDE-SIMETH 200-200-20 MG/5ML PO SUSP: 30.0000 mL | Freq: Four times a day (QID) | ORAL | Status: DC | PRN

## 2013-10-24 MED ORDER — METOPROLOL TARTRATE 50 MG PO TABS
50.0000 mg | ORAL_TABLET | Freq: Two times a day (BID) | ORAL | Status: DC
  Administered 2013-10-24 – 2013-10-26 (×6): 50 mg via ORAL
  Filled 2013-10-24 (×2): qty 1
  Filled 2013-10-24 (×2): qty 2
  Filled 2013-10-24 (×2): qty 1

## 2013-10-24 MED ORDER — LEVOFLOXACIN IN D5W 500 MG/100ML IV SOLN
500.0000 mg | INTRAVENOUS | Status: DC
  Administered 2013-10-24: 500 mg via INTRAVENOUS
  Filled 2013-10-24: qty 100

## 2013-10-24 MED ORDER — SODIUM CHLORIDE 0.9 % IV SOLN
250.0000 mL | INTRAVENOUS | Status: DC | PRN
  Administered 2013-10-24: 250 mL via INTRAVENOUS

## 2013-10-24 MED ORDER — POTASSIUM CHLORIDE CRYS ER 20 MEQ PO TBCR
20.0000 meq | EXTENDED_RELEASE_TABLET | Freq: Three times a day (TID) | ORAL | Status: DC
  Administered 2013-10-24 – 2013-10-25 (×5): 20 meq via ORAL
  Filled 2013-10-24 (×5): qty 1

## 2013-10-24 MED ORDER — AMLODIPINE BESYLATE 5 MG PO TABS
5.0000 mg | ORAL_TABLET | Freq: Every day | ORAL | Status: DC
  Administered 2013-10-24 – 2013-10-26 (×3): 5 mg via ORAL
  Filled 2013-10-24 (×3): qty 1

## 2013-10-25 LAB — URINE CULTURE
COLONY COUNT: NO GROWTH
CULTURE: NO GROWTH

## 2013-10-25 MED ORDER — GABAPENTIN 100 MG PO CAPS
200.0000 mg | ORAL_CAPSULE | Freq: Three times a day (TID) | ORAL | Status: DC
  Administered 2013-10-25 – 2013-10-26 (×6): 200 mg via ORAL
  Filled 2013-10-25 (×6): qty 2

## 2013-10-25 MED ORDER — ALBUTEROL SULFATE (2.5 MG/3ML) 0.083% IN NEBU
2.5000 mg | INHALATION_SOLUTION | Freq: Three times a day (TID) | RESPIRATORY_TRACT | Status: DC
  Administered 2013-10-25 – 2013-10-27 (×7): 2.5 mg via RESPIRATORY_TRACT
  Filled 2013-10-25 (×7): qty 3

## 2013-10-25 NOTE — Care Management Note (Signed)
    Page 1 of 1   10/25/2013     3:24:34 PM   CARE MANAGEMENT NOTE 10/25/2013  Patient:  [name], [name]   Account Number:  [id number]  Date Initiated:  10/25/2013  Documentation initiated by:  Claretha Cooper  Subjective/Objective Assessment:   Pt lives alone at home. Has the Flu.     Action/Plan:   Return home.   Anticipated DC Date:  10/25/2013   Anticipated DC Plan:  [place]  CM consult      Choice offered to / List presented to:             Status of service:  Completed, signed off Medicare Important Message given?   (If response is "NO", the following Medicare IM given date fields will be blank) Date Medicare IM given:   Date Additional Medicare IM given:    Discharge Disposition:    Per UR Regulation:    If discussed at Long Length of Stay Meetings, dates discussed:    Comments:  10/25/13 Claretha Cooper RN BSN CM

## 2013-10-25 NOTE — H&P (Signed)
[name], [name]             ACCOUNT NO.:  [id number]  MEDICAL RECORD NO.:  [id number]  LOCATION:  A340                          FACILITY:  [facility]  PHYSICIAN:  Paula Compton. Willey Blade, MD       DATE OF BIRTH:  [birth year]  DATE OF ADMISSION:  10/24/2013 DATE OF DISCHARGE:  LH                             HISTORY & PHYSICAL   CHIEF COMPLAINT:  Cough.  HISTORY OF PRESENT ILLNESS:  This patient is a 80 year old female who presented with cough, shortness of breath, body aches, and nasal drainage.  She had a temperature of 101.8.  She was initially seen at the office.  The chest x-ray revealed no acute infiltrate.  She was felt to likely have influenza.  She was given a 1st dose of Tamiflu in the office.  She was profoundly weak.  She was maintaining an adequate oxygen saturation on room air at 94%.  She has a history of chronic systolic heart failure.  She has been eating poorly over the past 2 days and is really taking in no significant amounts of nutrition or liquids.  PAST MEDICAL HISTORY: 1. Chronic systolic heart failure. 2. Breast cancer. 3. Chronic atrial fibrillation. 4. Hypertension. 5. Cholecystectomy. 6. Macular degeneration.  MEDICATIONS: 1. Azor 5/40 daily. 2. Eliquis 5 mg b.i.d. 3. Digoxin 0.125 mg daily. 4. Lasix 20 mg daily. 5. Gabapentin 200 mg t.i.d. 6. Lopressor 50 mg b.i.d. 7. Multivitamin. 8. PreserVision AREDS 1 b.i.d. 9. Potassium 20 mEq b.i.d.  ALLERGIES:  No known drug allergies.  SOCIAL HISTORY:  She is a former smoker.  She drinks a glass of wine a few days a week.  She does not use recreational substances.  She lives alone.  FAMILY HISTORY:  Significant for hypertension in her mother and BPH in her father.  Sister has hypertension.  REVIEW OF SYSTEMS:  She has had nausea and vomiting.  Also, she has had a headache.  She feels pain in her chest.  No abdominal pain.  No difficulty voiding.  PHYSICAL EXAMINATION:  VITAL SIGNS:  Temperature 99.8,  pulse 94, respirations 20, blood pressure 152/78, oxygen saturation 97%. GENERAL:  Weak-appearing elderly female. HEENT:  No scleral icterus.  Nose reveals clear secretions.  Oropharynx appears normal. NECK:  Reveals no thyromegaly or lymphadenopathy. LUNGS:  Clear. HEART:  Irregularly irregular and tachycardic in the 100-110 range initially at my office. ABDOMEN:  Soft and nontender with no organomegaly. GU:  No CVA tenderness. EXTREMITIES:  No cyanosis, clubbing, or edema. NEURO:  Weak, but alert and oriented.  She is not walking as well as usual. LYMPH NODES:  No cervical or supraclavicular enlargement.  LABORATORY DATA:  White count 6.4, hemoglobin 13.8, platelets 171,000; 77 neutrophils, 7 lymphocytes, 15 monocytes.  Sodium 136, potassium 3.2, bicarb 27, BUN 9, creatinine 0.64, glucose 155, albumin 3.8.  Digoxin level 0.6.  Her influenza panel reveals a positive influenza A by PCR.  IMPRESSION/PLAN: 1. Influenza A, treat with Tamiflu.  She is generally weak, with     associated symptoms of chest pain, nausea and vomiting.  We will     treat with Hydromet as needed.  We will treat with Zofran as  needed. 2. Hypokalemia, replace orally. 3. Chronic systolic heart failure.  She is not volume overloaded at     this point, and may be somewhat on the dry side.  We will hold her     Lasix temporarily. 4. Chronic atrial fibrillation.  Continue Eliquis.  Blood cultures were obtained and an initial dose of IV Levaquin was administered prior to her influenza panel returning.     Paula Compton. Willey Blade, MD     ROF/MEDQ  D:  10/25/2013  T:  10/25/2013  Job:  [id number]

## 2013-10-26 LAB — BASIC METABOLIC PANEL
BUN: 12 mg/dL (ref 6–23)
CHLORIDE: 99 meq/L (ref 96–112)
CO2: 26 mEq/L (ref 19–32)
Calcium: 8.4 mg/dL (ref 8.4–10.5)
Creatinine, Ser: 0.73 mg/dL (ref 0.50–1.10)
GFR calc non Af Amer: 71 mL/min — ABNORMAL LOW (ref >90)
GFR, EST AFRICAN AMERICAN: 82 mL/min — AB (ref >90)
GLUCOSE: 100 mg/dL — AB (ref 70–99)
Potassium: 4.5 mEq/L (ref 3.7–5.3)
Sodium: 137 mEq/L (ref 137–147)

## 2013-10-26 LAB — CBC
HEMATOCRIT: 38.6 % (ref 36.0–46.0)
HEMOGLOBIN: 13.3 g/dL (ref 12.0–15.0)
MCH: 33.1 pg (ref 26.0–34.0)
MCHC: 34.5 g/dL (ref 30.0–36.0)
MCV: 96 fL (ref 78.0–100.0)
Platelets: 184 10*3/uL (ref 150–400)
RBC: 4.02 MIL/uL (ref 3.87–5.11)
RDW: 13.9 % (ref 11.5–15.5)
WBC: 4.7 10*3/uL (ref 4.0–10.5)

## 2013-10-26 MED ORDER — FUROSEMIDE 20 MG PO TABS
20.0000 mg | ORAL_TABLET | Freq: Every day | ORAL | Status: DC
  Administered 2013-10-26: 20 mg via ORAL
  Filled 2013-10-26: qty 1

## 2013-10-26 MED ORDER — POTASSIUM CHLORIDE CRYS ER 20 MEQ PO TBCR
20.0000 meq | EXTENDED_RELEASE_TABLET | Freq: Two times a day (BID) | ORAL | Status: DC
  Administered 2013-10-26 (×2): 20 meq via ORAL
  Filled 2013-10-26 (×3): qty 1

## 2013-10-26 NOTE — Progress Notes (Signed)
NAMEMarland Kitchen  SHARRIE, SELF NO.:  [id number]  MEDICAL RECORD NO.:  [id number]  LOCATION:                        FACILITY:  [facility]  PHYSICIAN:  Paula Compton. Willey Blade, MD       DATE OF BIRTH:  [birth year]  DATE OF PROCEDURE:  10/25/2013 DATE OF DISCHARGE:                                PROGRESS NOTE   She continues to feel discomfort in her chest and continues to cough with very minimal sputum production.  Her temperature has improved.  She continues to feel some intermittent nausea.  She has very little appetite and continues to feel weak.  PHYSICAL EXAMINATION:  VITAL SIGNS:  Temperature 98.6, pulse 55, respirations 18, blood pressure 134/63, and oxygen saturation 97%. LUNGS:  Bilateral wheezes are present. HEART:  Irregularly irregular. ABDOMEN:  Soft and nontender. NEUROLOGIC:  Alert and oriented.  IMPRESSION/PLAN: 1. Influenza A.  Continue Tamiflu.  She has new wheezes this morning     which will be treated with albuterol nebulizer treatments. 2. Hypokalemia, 3.2.  Continue potassium supplements. 3. Chronic systolic heart failure, stable. 4. Chronic atrial fibrillation.  Continue metoprolol and digoxin. 5. Hypertension.  Continue current antibiotic therapy.     Paula Compton. Willey Blade, MD     ROF/MEDQ  D:  10/25/2013  T:  10/25/2013  Job:  [id number]

## 2013-10-27 MED ORDER — OSELTAMIVIR PHOSPHATE 75 MG PO CAPS: 75.0000 mg | ORAL_CAPSULE | Freq: Two times a day (BID) | ORAL | Status: DC

## 2013-10-27 NOTE — Progress Notes (Signed)
Patient with orders to be discharge home. Discharge instructions given, patient verbalized understanding. Prescriptions given. Patient wants to take all morning medications at home. Patient stable. Patient left with niece in private vehicle.

## 2013-10-27 NOTE — Progress Notes (Signed)
[name], GRAMLICH NO.:  [id number]  MEDICAL RECORD NO.:  [id number]  LOCATION:                                 FACILITY:  PHYSICIAN:  Paula Compton. Willey Blade, MD       DATE OF BIRTH:  10/03/1917  DATE OF PROCEDURE:  10/26/2013 DATE OF DISCHARGE:                                PROGRESS NOTE   She is feeling stronger.  She continues to have some wheezing, weakness persisted to the point, however that she does not feel comfortable going home.  She has not had fever.  PHYSICAL EXAMINATION:  VITAL SIGNS:  Temperature 98.4, pulse 59, respirations 19, blood pressure 130/75, oxygen saturations vary yesterday from 86-95.  She is on O2 at 2 L by nasal cannula.  She has 94% saturation now. LUNGS:  Reveal mild wheezes. HEART:  Irregularly irregular. ABDOMEN:  Soft and nontender.  IMPRESSION AND PLAN: 1. Influenza A.  Continue Tamiflu.  Continue albuterol nebulizer     treatments. 2. Hypokalemia, resolved.  Serum potassium has risen from 3.2-4.5. 3. Urine culture is negative.  CBC is normal. 4. Chronic systolic heart failure, stable.  Restart Lasix. 5. Chronic atrial fibrillation.  Continue Eliquis.     Paula Compton. Willey Blade, MD     ROF/MEDQ  D:  10/26/2013  T:  10/27/2013  Job:  [id number]

## 2013-10-30 NOTE — Discharge Summary (Signed)
NAME:  [name], [name]                  ACCOUNT NO.:  MEDICAL RECORD NO.:  [id number]  LOCATION:                                 FACILITY:  PHYSICIAN:  Paula Compton. Willey Blade, MD       DATE OF BIRTH:  1917-04-10  DATE OF ADMISSION: DATE OF DISCHARGE:  LH                              DISCHARGE SUMMARY   DISCHARGE DIAGNOSES: 1. Influenza A. 2. Chronic systolic congestive heart failure. 3. Chronic atrial flutter. 4. Hypertension. 5. Spinal stenosis. 6. Macular degeneration. 7. Chronic obstructive pulmonary disease.  DISCHARGE MEDICATIONS: 1. Tamiflu 75 mg b.i.d. 2. Azor 540 daily. 3. Digoxin 0.125 mg daily. 4. Eliquis 5 mg b.i.d. 5. Furosemide 20 mg daily. 6. Gabapentin 200 mg t.i.d. 7. Metoprolol 50 mg b.i.d. 8. Potassium 20 mEq b.i.d. 9. PreserVision b.i.d.  HOSPITAL COURSE:  This patient is a 80 year old female who presented with a fever to 101.8, cough, congestion, and generalized weakness.  She had been eating and drinking very poorly for 2 days.  She was seen initially in the office.  She was weak and febrile and tachycardiac. She has chronic atrial flutter and chronic systolic heart failure.  She was hospitalized and found to have a positive influenza A by PCR.  She was treated with Tamiflu.  She had been given her first dose in the office prior to admission.  She developed wheezes requiring albuterol nebulizer treatments.  She developed hypoxia with a drop in her oxygen saturation to 86%.  Her oxygenation improved with treatment and returned to levels above 90 on room air.  Her white count was normal at 6.4.  She was hypokalemic at 3.2.  She was treated with potassium supplements with normalization of her potassium to 4.5.  Her Lasix was initially held, but was restarted on the 15th.  Her digoxin level was 0.6.  Her urine culture was negative.  Her chest x-ray revealed no acute infiltrate.  Changes of COPD were stable.  Her overall condition significantly improved  to the point that she was stable for discharge on the morning of the 16th.  She will be seen in followup in my office in 1 week.  She will complete her course of Tamiflu.     Paula Compton. Willey Blade, MD     ROF/MEDQ  D:  10/27/2013  T:  10/28/2013  Job:  [id number]

## 2014-03-15 ENCOUNTER — Other Ambulatory Visit (HOSPITAL_COMMUNITY): Payer: Self-pay | Admitting: Internal Medicine

## 2014-03-15 DIAGNOSIS — R519 Headache, unspecified: Secondary | ICD-10-CM

## 2014-03-15 DIAGNOSIS — R51 Headache: Principal | ICD-10-CM

## 2014-03-16 ENCOUNTER — Encounter (HOSPITAL_COMMUNITY): Payer: Self-pay

## 2014-03-16 ENCOUNTER — Ambulatory Visit (HOSPITAL_COMMUNITY)
Admission: RE | Admit: 2014-03-16 | Discharge: 2014-03-16 | Disposition: A | Discharge status: Home / Self Care | Payer: Medicare FFS | Source: Ambulatory Visit | Attending: Internal Medicine | Admitting: Internal Medicine

## 2014-03-16 DIAGNOSIS — I6789 Other cerebrovascular disease: Secondary | ICD-10-CM | POA: Insufficient documentation

## 2014-03-16 DIAGNOSIS — R519 Headache, unspecified: Secondary | ICD-10-CM

## 2014-03-16 DIAGNOSIS — G319 Degenerative disease of nervous system, unspecified: Secondary | ICD-10-CM | POA: Insufficient documentation

## 2014-03-16 DIAGNOSIS — R51 Headache: Secondary | ICD-10-CM | POA: Insufficient documentation

## 2014-05-02 ENCOUNTER — Ambulatory Visit (HOSPITAL_COMMUNITY): Payer: Medicare FFS

## 2014-05-16 ENCOUNTER — Encounter (HOSPITAL_COMMUNITY): Payer: Self-pay

## 2014-05-16 ENCOUNTER — Encounter (HOSPITAL_COMMUNITY): Payer: Medicare FFS | Attending: Hematology and Oncology

## 2014-05-16 VITALS — BP 133/84 | HR 89 | Temp 98.1°F | Resp 18 | Wt 138.0 lb

## 2014-05-16 DIAGNOSIS — C50919 Malignant neoplasm of unspecified site of unspecified female breast: Secondary | ICD-10-CM | POA: Diagnosis present

## 2014-05-16 DIAGNOSIS — Z853 Personal history of malignant neoplasm of breast: Secondary | ICD-10-CM

## 2014-05-16 DIAGNOSIS — Z9010 Acquired absence of unspecified breast and nipple: Secondary | ICD-10-CM | POA: Diagnosis not present

## 2014-05-16 DIAGNOSIS — J309 Allergic rhinitis, unspecified: Secondary | ICD-10-CM | POA: Insufficient documentation

## 2014-05-16 DIAGNOSIS — Z87891 Personal history of nicotine dependence: Secondary | ICD-10-CM | POA: Diagnosis not present

## 2014-05-16 DIAGNOSIS — Z170 Estrogen receptor positive status [ER+]: Secondary | ICD-10-CM | POA: Insufficient documentation

## 2014-05-16 DIAGNOSIS — C50912 Malignant neoplasm of unspecified site of left female breast: Secondary | ICD-10-CM

## 2014-05-16 DIAGNOSIS — I10 Essential (primary) hypertension: Secondary | ICD-10-CM | POA: Diagnosis not present

## 2014-05-16 DIAGNOSIS — Z79899 Other long term (current) drug therapy: Secondary | ICD-10-CM | POA: Insufficient documentation

## 2014-05-16 LAB — CBC WITH DIFFERENTIAL/PLATELET
BASOS PCT: 1 % (ref 0–1)
Basophils Absolute: 0.1 10*3/uL (ref 0.0–0.1)
EOS PCT: 3 % (ref 0–5)
Eosinophils Absolute: 0.2 10*3/uL (ref 0.0–0.7)
HEMATOCRIT: 43.3 % (ref 36.0–46.0)
HEMOGLOBIN: 14.6 g/dL (ref 12.0–15.0)
LYMPHS PCT: 30 % (ref 12–46)
Lymphs Abs: 2.5 10*3/uL (ref 0.7–4.0)
MCH: 32.2 pg (ref 26.0–34.0)
MCHC: 33.7 g/dL (ref 30.0–36.0)
MCV: 95.6 fL (ref 78.0–100.0)
Monocytes Absolute: 0.7 10*3/uL (ref 0.1–1.0)
Monocytes Relative: 8 % (ref 3–12)
Neutro Abs: 4.7 10*3/uL (ref 1.7–7.7)
Neutrophils Relative %: 58 % (ref 43–77)
Platelets: 212 10*3/uL (ref 150–400)
RBC: 4.53 MIL/uL (ref 3.87–5.11)
RDW: 14.2 % (ref 11.5–15.5)
WBC: 8.2 10*3/uL (ref 4.0–10.5)

## 2014-05-16 LAB — COMPREHENSIVE METABOLIC PANEL
ALK PHOS: 93 U/L (ref 39–117)
ALT: 13 U/L (ref 0–35)
ANION GAP: 13 (ref 5–15)
AST: 24 U/L (ref 0–37)
Albumin: 3.8 g/dL (ref 3.5–5.2)
BILIRUBIN TOTAL: 0.5 mg/dL (ref 0.3–1.2)
BUN: 12 mg/dL (ref 6–23)
CHLORIDE: 97 meq/L (ref 96–112)
CO2: 25 meq/L (ref 19–32)
CREATININE: 0.81 mg/dL (ref 0.50–1.10)
Calcium: 9.1 mg/dL (ref 8.4–10.5)
GFR, EST AFRICAN AMERICAN: 70 mL/min — AB (ref >90)
GFR, EST NON AFRICAN AMERICAN: 60 mL/min — AB (ref >90)
GLUCOSE: 124 mg/dL — AB (ref 70–99)
POTASSIUM: 4.3 meq/L (ref 3.7–5.3)
Sodium: 135 mEq/L — ABNORMAL LOW (ref 137–147)
Total Protein: 8 g/dL (ref 6.0–8.3)

## 2014-05-16 MED ORDER — FLUTICASONE PROPIONATE 50 MCG/ACT NA SUSP: NASAL | Status: DC

## 2014-05-16 NOTE — Patient Instructions (Signed)
[name] Discharge Instructions  RECOMMENDATIONS MADE BY THE CONSULTANT AND ANY TEST RESULTS WILL BE SENT TO YOUR REFERRING PHYSICIAN.  EXAM FINDINGS BY THE PHYSICIAN TODAY AND SIGNS OR SYMPTOMS TO REPORT TO CLINIC OR PRIMARY PHYSICIAN: Exam and findings as discussed by Dr.Formanek.  MEDICATIONS PRESCRIBED:  A [name] prescription for Flonase was sent to your pharmacy. Take as directed.  INSTRUCTIONS/FOLLOW-UP: Lab work today. We will call if there are any abnormal results. Return to clinic in 1 year for follow up. Call with any issues/concerns as needed.  Thank you for choosing [facility] to provide your oncology and hematology care.  To afford each patient quality time with our providers, please arrive at least 15 minutes before your scheduled appointment time.  With your help, our goal is to use those 15 minutes to complete the necessary work-up to ensure our physicians have the information they need to help with your evaluation and healthcare recommendations.    Effective January 1st, 2014, we ask that you re-schedule your appointment with our physicians should you arrive 10 or more minutes late for your appointment.  We strive to give you quality time with our providers, and arriving late affects you and other patients whose appointments are after yours.    Again, thank you for choosing [facility].  Our hope is that these requests will decrease the amount of time that you wait before being seen by our physicians.       _____________________________________________________________  Should you have questions after your visit to [facility], please contact our office at [phone] between the hours of 8:30 a.m. and 4:30 p.m.  Voicemails left after 4:30 p.m. will not be returned until the following business day.  For prescription refill requests, have your pharmacy contact our office with your prescription refill request.     _______________________________________________________________  We hope that we have given you very good care.  You may receive a patient satisfaction survey in the mail, please complete it and return it as soon as possible.  We value your feedback!  _______________________________________________________________  Have you asked about our STAR program?  STAR stands for Survivorship Training and Rehabilitation, and this is a nationally recognized cancer care program that focuses on survivorship and rehabilitation.  Cancer and cancer treatments may cause problems, such as, pain, making you feel tired and keeping you from doing the things that you need or want to do. Cancer rehabilitation can help. Our goal is to reduce these troubling effects and help you have the best quality of life possible.  You may receive a survey from a nurse that asks questions about your current state of health.  Based on the survey results, all eligible patients will be referred to the [facility] program for an evaluation so we can better serve you!  A frequently asked questions sheet is available upon request.

## 2014-05-16 NOTE — Progress Notes (Signed)
[facility]  OFFICE PROGRESS Sarah Miner, MD [address] [place] [address]  DIAGNOSIS: Breast cancer, left - Plan: CBC with Differential, Comprehensive metabolic panel, Vitamin D 25 hydroxy, CBC with Differential, Comprehensive metabolic panel, CEA, Cancer antigen 27.29, Vitamin D 25 hydroxy, CBC with Differential, Comprehensive metabolic panel, Vitamin D 25 hydroxy  Chief Complaint  Patient presents with  . Left breast cancer stage I    CURRENT THERAPY: Stage I left breast cancer, now on surveillance only. Eliquis 5 mg twice a day.  INTERVAL HISTORY: [name] 80 y.o. female returns for followup of left breast cancer, stage I, ER/PR positive, Ki-67 25%, status post left mastectomy with 6 sentinel nodes, 5 years of letrozole ending in 2008.  In February 2015 she was admitted for 3 or 4 days to 2 influenza. Currently she feels well with good appetite but with nasal drip weekly in the morning when she awakens. She denies any nausea, vomiting, diarrhea, constipation, incontinence, dysuria, hematuria, vaginal bleeding, fever, night sweats, epistaxis, hemoptysis, lower extremity swelling or redness, joint pain, skin rash, headache, or seizures. She does have a caretaker at home.  MEDICAL HISTORY: Past Medical History  Diagnosis Date  . Hypertension   . Fluttering heart     INTERIM HISTORY: has Flu on her problem list.   Stage I (T1c N0 M0) left-sided adenocarcinoma of the breast, status post left modified radical mastectomy for a 1.4-cm cancer, ER positive at 90%, PR positive at 30% with 6 negative nodes, Ki-67 marker was high at 25%. Treated  with letrozole 2.5 mg starting on 05/09/2002, ending after 5 years in 2008.   ALLERGIES:  has No Known Allergies.  MEDICATIONS: has a current medication list which includes the following prescription(s): amlodipine-olmesartan, apixaban, furosemide, gabapentin,  metoprolol, potassium chloride sa, and fluticasone.  SURGICAL HISTORY:  Past Surgical History  Procedure Laterality Date  . Cholecystectomy    . Breast surgery    . Tonsillectomy      FAMILY HISTORY: family history includes Cancer in her paternal aunt; Hypertension in her mother.  SOCIAL HISTORY:  reports that she has quit smoking. She has never used smokeless tobacco. She reports that she drinks about 5.4 ounces of alcohol per week. She reports that she does not use illicit drugs.  REVIEW OF SYSTEMS:  Other than that discussed above is noncontributory.  PHYSICAL EXAMINATION: ECOG PERFORMANCE STATUS: 1 - Symptomatic but completely ambulatory  Blood pressure 133/84, pulse 89, temperature 98.1 F (36.7 C), temperature source Oral, resp. rate 18, weight 138 lb (62.596 kg).  GENERAL:alert, no distress and comfortable SKIN: skin color, texture, turgor are normal, no rashes or significant lesions EYES: PERLA; Conjunctiva are pink and non-injected, sclera clear SINUSES: No redness or tenderness over maxillary or ethmoid sinuses OROPHARYNX:no exudate, no erythema on lips, buccal mucosa, or tongue. NECK: supple, thyroid normal size, non-tender, without nodularity. No masses CHEST: Status post left mastectomy with no subcutaneous nodules. LYMPH:  no palpable lymphadenopathy in the cervical, axillary or inguinal LUNGS: clear to auscultation and percussion with normal breathing effort HEART: Irregularly irregular with no S3. ABDOMEN:abdomen soft, non-tender and normal bowel sounds MUSCULOSKELETAL:no cyanosis of digits and no clubbing. Range of motion normal.  NEURO: alert & oriented x 3 with fluent speech, no focal motor/sensory deficits   LABORATORY DATA: Office Visit on 05/16/2014  Component Date Value Ref Range Status  . WBC 05/16/2014 8.2  4.0 -  10.5 K/uL Final  . RBC 05/16/2014 4.53  3.87 - 5.11 MIL/uL Final  . Hemoglobin 05/16/2014 14.6  12.0 - 15.0 g/dL Final  . HCT 05/16/2014  43.3  36.0 - 46.0 % Final  . MCV 05/16/2014 95.6  78.0 - 100.0 fL Final  . MCH 05/16/2014 32.2  26.0 - 34.0 pg Final  . MCHC 05/16/2014 33.7  30.0 - 36.0 g/dL Final  . RDW 05/16/2014 14.2  11.5 - 15.5 % Final  . Platelets 05/16/2014 212  150 - 400 K/uL Final  . Neutrophils Relative % 05/16/2014 58  43 - 77 % Final  . Lymphocytes Relative 05/16/2014 30  12 - 46 % Final  . Monocytes Relative 05/16/2014 8  3 - 12 % Final  . Eosinophils Relative 05/16/2014 3  0 - 5 % Final  . Basophils Relative 05/16/2014 1  0 - 1 % Final  . Neutro Abs 05/16/2014 4.7  1.7 - 7.7 K/uL Final  . Lymphs Abs 05/16/2014 2.5  0.7 - 4.0 K/uL Final  . Monocytes Absolute 05/16/2014 0.7  0.1 - 1.0 K/uL Final  . Eosinophils Absolute 05/16/2014 0.2  0.0 - 0.7 K/uL Final  . Basophils Absolute 05/16/2014 0.1  0.0 - 0.1 K/uL Final  . Sodium 05/16/2014 135* 137 - 147 mEq/L Final  . Potassium 05/16/2014 4.3  3.7 - 5.3 mEq/L Final  . Chloride 05/16/2014 97  96 - 112 mEq/L Final  . CO2 05/16/2014 25  19 - 32 mEq/L Final  . Glucose, Bld 05/16/2014 124* 70 - 99 mg/dL Final  . BUN 05/16/2014 12  6 - 23 mg/dL Final  . Creatinine, Ser 05/16/2014 0.81  0.50 - 1.10 mg/dL Final  . Calcium 05/16/2014 9.1  8.4 - 10.5 mg/dL Final  . Total Protein 05/16/2014 8.0  6.0 - 8.3 g/dL Final  . Albumin 05/16/2014 3.8  3.5 - 5.2 g/dL Final  . AST 05/16/2014 24  0 - 37 U/L Final  . ALT 05/16/2014 13  0 - 35 U/L Final  . Alkaline Phosphatase 05/16/2014 93  39 - 117 U/L Final  . Total Bilirubin 05/16/2014 0.5  0.3 - 1.2 mg/dL Final  . GFR calc non Af Amer 05/16/2014 60* >90 mL/min Final  . GFR calc Af Amer 05/16/2014 70* >90 mL/min Final   Comment: (NOTE)                          The eGFR has been calculated using the CKD EPI equation.                          This calculation has not been validated in all clinical situations.                          eGFR's persistently <90 mL/min signify possible Chronic Kidney                           Disease.  . Anion gap 05/16/2014 13  5 - 15 Final  . Vit D, 25-Hydroxy 05/16/2014 35  30 - 89 ng/mL Final   Comment: (NOTE)                          This assay accurately quantifies Vitamin D, which is the sum of the  25-Hydroxy forms of Vitamin D2 and D3.  Studies have shown that the                          optimum concentration of 25-Hydroxy Vitamin D is 30 ng/mL or higher.                           Concentrations of Vitamin D between 20 and 29 ng/mL are considered to                          be insufficient and concentrations less than 20 ng/mL are considered                          to be deficient for Vitamin D.                          Performed at [facility]    PATHOLOGY:  Infiltrating ductal carcinoma, 1.4 cm, ER +90%, PR +30% with 6 negative nodes, Ki-67 25% status post left modified radical mastectomy July 2003 followed by 5 years of letrozole.  Urinalysis No results found for this basename: colorurine,  appearanceur,  labspec,  phurine,  glucoseu,  hgbur,  bilirubinur,  ketonesur,  proteinur,  urobilinogen,  nitrite,  leukocytesur    RADIOGRAPHIC STUDIES: CT Head Wo Contrast Status: Final result         PACS Images    Show images for CT Head Wo Contrast         Study Result    CLINICAL DATA: Headaches. Hypertension.  EXAM:  CT HEAD WITHOUT CONTRAST  TECHNIQUE:  Contiguous axial images were obtained from the base of the skull  through the vertex without contrast.  COMPARISON: 03/21/2012.  FINDINGS:  Normal for age cerebral volume (80 years old). Hypoattenuation of  the white matter bilaterally consistent with chronic microvascular  ischemic change. No visible acute stroke. Remote lacunar infarct  left thalamus better seen previously.  Calcified or ossified lesion projecting from the inner table of the  skull on the right most consistent with a benign osteoma. Heavily  calcified meningioma could have this appearance.  Because of atrophy,  probably no appreciable mass effect on the underlying brain. Similar  size compared to priors, 10 x 17 mm cross-section. Visualized  paranasal sinuses show no acute fluid accumulation. Negative middle  ear and mastoid regions. Vascular calcifications noted in the  carotid siphons.  IMPRESSION:  Atrophy and small vessel disease. No acute intracranial findings.  Electronically Signed  By: Davonna Belling M.D.  On: 03/16/2014 11:     ASSESSMENT:  #1. Stage I left breast cancer, no evidence of disease, having completed letrozole therapy in 2008 after left mastectomy and sentinel node biopsy in 2003.  #2. Allergic rhinitis, symptomatic.   PLAN:  #1. Flonase inhaler 2 puffs in each nostril at bedtime #2. Followup in one year with CBC, chem profile, CEA, CA 27-29, and vitamin D.   All questions were answered. The patient knows to call the clinic with any problems, questions or concerns. We can certainly see the patient much sooner if necessary.   I spent 25 minutes counseling the patient face to face. The total time spent in the appointment was 30 minutes.    Maurilio Lovely, MD 05/17/2014 8:21 AM  DISCLAIMER:  This note  was dictated with voice recognition software.  Similar sounding words can inadvertently be transcribed inaccurately and may not be corrected upon review.

## 2014-05-16 NOTE — Progress Notes (Signed)
[name] presented for labwork. Labs per MD order drawn via Peripheral Line 23 gauge needle inserted in right antecubital.  Good blood return present. Procedure without incident.  Needle removed intact. Patient tolerated procedure well.

## 2014-05-17 LAB — VITAMIN D 25 HYDROXY (VIT D DEFICIENCY, FRACTURES): VIT D 25 HYDROXY: 35 ng/mL (ref 30–89)

## 2014-07-06 ENCOUNTER — Other Ambulatory Visit (HOSPITAL_COMMUNITY): Payer: Self-pay | Admitting: Internal Medicine

## 2014-07-06 DIAGNOSIS — Z1231 Encounter for screening mammogram for malignant neoplasm of breast: Secondary | ICD-10-CM

## 2014-07-11 ENCOUNTER — Ambulatory Visit (HOSPITAL_COMMUNITY)
Admission: RE | Admit: 2014-07-11 | Discharge: 2014-07-11 | Disposition: A | Discharge status: Home / Self Care | Payer: Medicare FFS | Source: Ambulatory Visit | Attending: Internal Medicine | Admitting: Internal Medicine

## 2014-07-11 DIAGNOSIS — Z1231 Encounter for screening mammogram for malignant neoplasm of breast: Secondary | ICD-10-CM

## 2015-05-04 ENCOUNTER — Emergency Department (HOSPITAL_COMMUNITY): Payer: Medicare FFS

## 2015-05-04 ENCOUNTER — Encounter (HOSPITAL_COMMUNITY): Payer: Self-pay | Admitting: Emergency Medicine

## 2015-05-04 ENCOUNTER — Observation Stay (HOSPITAL_COMMUNITY)
Admission: EM | Admit: 2015-05-04 | Discharge: 2015-05-05 | Disposition: A | Discharge status: Home / Self Care | Payer: Medicare FFS | Attending: Emergency Medicine | Admitting: Emergency Medicine

## 2015-05-04 DIAGNOSIS — I4892 Unspecified atrial flutter: Secondary | ICD-10-CM | POA: Diagnosis not present

## 2015-05-04 DIAGNOSIS — Z79899 Other long term (current) drug therapy: Secondary | ICD-10-CM | POA: Insufficient documentation

## 2015-05-04 DIAGNOSIS — K859 Acute pancreatitis, unspecified: Secondary | ICD-10-CM | POA: Diagnosis not present

## 2015-05-04 DIAGNOSIS — G8929 Other chronic pain: Secondary | ICD-10-CM | POA: Insufficient documentation

## 2015-05-04 DIAGNOSIS — I4901 Ventricular fibrillation: Secondary | ICD-10-CM | POA: Insufficient documentation

## 2015-05-04 DIAGNOSIS — R079 Chest pain, unspecified: Secondary | ICD-10-CM | POA: Diagnosis present

## 2015-05-04 DIAGNOSIS — I5023 Acute on chronic systolic (congestive) heart failure: Secondary | ICD-10-CM | POA: Insufficient documentation

## 2015-05-04 DIAGNOSIS — H3530 Unspecified macular degeneration: Secondary | ICD-10-CM | POA: Diagnosis not present

## 2015-05-04 DIAGNOSIS — R109 Unspecified abdominal pain: Secondary | ICD-10-CM

## 2015-05-04 DIAGNOSIS — R52 Pain, unspecified: Secondary | ICD-10-CM

## 2015-05-04 DIAGNOSIS — I10 Essential (primary) hypertension: Secondary | ICD-10-CM | POA: Insufficient documentation

## 2015-05-04 DIAGNOSIS — Z859 Personal history of malignant neoplasm, unspecified: Secondary | ICD-10-CM | POA: Insufficient documentation

## 2015-05-04 HISTORY — DX: Malignant (primary) neoplasm, unspecified: C80.1

## 2015-05-04 HISTORY — DX: Acute pancreatitis without necrosis or infection, unspecified: K85.90

## 2015-05-04 LAB — CBC WITH DIFFERENTIAL/PLATELET
BASOS ABS: 0 10*3/uL (ref 0.0–0.1)
BASOS PCT: 0 % (ref 0–1)
EOS ABS: 0 10*3/uL (ref 0.0–0.7)
Eosinophils Relative: 0 % (ref 0–5)
HCT: 45.4 % (ref 36.0–46.0)
Hemoglobin: 15.3 g/dL — ABNORMAL HIGH (ref 12.0–15.0)
Lymphocytes Relative: 8 % — ABNORMAL LOW (ref 12–46)
Lymphs Abs: 1.1 10*3/uL (ref 0.7–4.0)
MCH: 32.3 pg (ref 26.0–34.0)
MCHC: 33.7 g/dL (ref 30.0–36.0)
MCV: 96 fL (ref 78.0–100.0)
MONO ABS: 0.9 10*3/uL (ref 0.1–1.0)
Monocytes Relative: 6 % (ref 3–12)
Neutro Abs: 12.4 10*3/uL — ABNORMAL HIGH (ref 1.7–7.7)
Neutrophils Relative %: 86 % — ABNORMAL HIGH (ref 43–77)
PLATELETS: 217 10*3/uL (ref 150–400)
RBC: 4.73 MIL/uL (ref 3.87–5.11)
RDW: 13.8 % (ref 11.5–15.5)
WBC: 14.4 10*3/uL — AB (ref 4.0–10.5)

## 2015-05-04 LAB — COMPREHENSIVE METABOLIC PANEL
ALBUMIN: 4.2 g/dL (ref 3.5–5.0)
ALK PHOS: 81 U/L (ref 38–126)
ALT: 16 U/L (ref 14–54)
AST: 26 U/L (ref 15–41)
Anion gap: 13 (ref 5–15)
BILIRUBIN TOTAL: 1.7 mg/dL — AB (ref 0.3–1.2)
BUN: 11 mg/dL (ref 6–20)
CHLORIDE: 94 mmol/L — AB (ref 101–111)
CO2: 26 mmol/L (ref 22–32)
Calcium: 9.2 mg/dL (ref 8.9–10.3)
Creatinine, Ser: 0.87 mg/dL (ref 0.44–1.00)
GFR calc non Af Amer: 55 mL/min — ABNORMAL LOW (ref >60)
Glucose, Bld: 214 mg/dL — ABNORMAL HIGH (ref 65–99)
POTASSIUM: 3.6 mmol/L (ref 3.5–5.1)
Sodium: 133 mmol/L — ABNORMAL LOW (ref 135–145)
Total Protein: 7.7 g/dL (ref 6.5–8.1)

## 2015-05-04 LAB — BRAIN NATRIURETIC PEPTIDE: B NATRIURETIC PEPTIDE 5: 974 pg/mL — AB (ref 0.0–100.0)

## 2015-05-04 LAB — LIPASE, BLOOD: LIPASE: 46 U/L (ref 22–51)

## 2015-05-04 LAB — D-DIMER, QUANTITATIVE (NOT AT ARMC): D DIMER QUANT: 0.92 ug{FEU}/mL — AB (ref 0.00–0.48)

## 2015-05-04 LAB — TROPONIN I: Troponin I: 0.03 ng/mL (ref <0.031)

## 2015-05-04 MED ORDER — POTASSIUM CHLORIDE CRYS ER 20 MEQ PO TBCR
20.0000 meq | EXTENDED_RELEASE_TABLET | Freq: Two times a day (BID) | ORAL | Status: DC
  Administered 2015-05-04 – 2015-05-05 (×3): 20 meq via ORAL
  Filled 2015-05-04 (×3): qty 1

## 2015-05-04 MED ORDER — ACETAMINOPHEN 650 MG RE SUPP: 650.0000 mg | Freq: Four times a day (QID) | RECTAL | Status: DC | PRN

## 2015-05-04 MED ORDER — OCUVITE PO TABS
1.0000 | ORAL_TABLET | Freq: Every day | ORAL | Status: DC
  Administered 2015-05-04 – 2015-05-05 (×2): 1 via ORAL
  Filled 2015-05-04 (×3): qty 1

## 2015-05-04 MED ORDER — ASPIRIN EC 81 MG PO TBEC
81.0000 mg | DELAYED_RELEASE_TABLET | Freq: Every day | ORAL | Status: DC
  Administered 2015-05-04 – 2015-05-05 (×2): 81 mg via ORAL
  Filled 2015-05-04 (×2): qty 1

## 2015-05-04 MED ORDER — MORPHINE SULFATE 2 MG/ML IJ SOLN
2.0000 mg | INTRAMUSCULAR | Status: DC | PRN
  Administered 2015-05-04 (×2): 2 mg via INTRAVENOUS
  Filled 2015-05-04 (×2): qty 1

## 2015-05-04 MED ORDER — PANTOPRAZOLE SODIUM 40 MG IV SOLR
40.0000 mg | INTRAVENOUS | Status: DC
  Administered 2015-05-04: 40 mg via INTRAVENOUS
  Filled 2015-05-04: qty 40

## 2015-05-04 MED ORDER — CETYLPYRIDINIUM CHLORIDE 0.05 % MT LIQD
7.0000 mL | Freq: Two times a day (BID) | OROMUCOSAL | Status: DC
  Administered 2015-05-04 – 2015-05-05 (×3): 7 mL via OROMUCOSAL

## 2015-05-04 MED ORDER — SODIUM CHLORIDE 0.9 % IV SOLN: 250.0000 mL | INTRAVENOUS | Status: DC | PRN

## 2015-05-04 MED ORDER — IOHEXOL 300 MG/ML  SOLN
100.0000 mL | Freq: Once | INTRAMUSCULAR | Status: AC | PRN
  Administered 2015-05-04: 100 mL via INTRAVENOUS

## 2015-05-04 MED ORDER — ACETAMINOPHEN 325 MG PO TABS
650.0000 mg | ORAL_TABLET | Freq: Four times a day (QID) | ORAL | Status: DC | PRN
Start: 2015-05-04 — End: 2015-05-05

## 2015-05-04 MED ORDER — GABAPENTIN 100 MG PO CAPS
100.0000 mg | ORAL_CAPSULE | Freq: Two times a day (BID) | ORAL | Status: DC
  Administered 2015-05-04 – 2015-05-05 (×3): 100 mg via ORAL
  Filled 2015-05-04 (×3): qty 1

## 2015-05-04 MED ORDER — APIXABAN 2.5 MG PO TABS
2.5000 mg | ORAL_TABLET | Freq: Two times a day (BID) | ORAL | Status: DC
  Administered 2015-05-04 – 2015-05-05 (×3): 2.5 mg via ORAL
  Filled 2015-05-04 (×5): qty 1

## 2015-05-04 MED ORDER — HYDROCODONE-ACETAMINOPHEN 5-325 MG PO TABS: 1.0000 | ORAL_TABLET | ORAL | Status: DC | PRN

## 2015-05-04 MED ORDER — BENAZEPRIL HCL 10 MG PO TABS
10.0000 mg | ORAL_TABLET | Freq: Every day | ORAL | Status: DC
  Administered 2015-05-04 – 2015-05-05 (×2): 10 mg via ORAL
  Filled 2015-05-04 (×2): qty 1

## 2015-05-04 MED ORDER — METOPROLOL TARTRATE 50 MG PO TABS
50.0000 mg | ORAL_TABLET | Freq: Two times a day (BID) | ORAL | Status: DC
Start: 2015-05-04 — End: 2015-05-05
  Administered 2015-05-04 – 2015-05-05 (×3): 50 mg via ORAL
  Filled 2015-05-04 (×3): qty 1

## 2015-05-04 MED ORDER — ALUM & MAG HYDROXIDE-SIMETH 200-200-20 MG/5ML PO SUSP: 30.0000 mL | Freq: Four times a day (QID) | ORAL | Status: DC | PRN

## 2015-05-04 MED ORDER — ONDANSETRON HCL 4 MG/2ML IJ SOLN
4.0000 mg | Freq: Four times a day (QID) | INTRAMUSCULAR | Status: DC | PRN
  Administered 2015-05-04: 4 mg via INTRAVENOUS
  Filled 2015-05-04: qty 2

## 2015-05-04 MED ORDER — IOHEXOL 300 MG/ML  SOLN
25.0000 mL | Freq: Once | INTRAMUSCULAR | Status: AC | PRN
  Administered 2015-05-04: 25 mL via ORAL

## 2015-05-04 MED ORDER — SODIUM CHLORIDE 0.9 % IJ SOLN
3.0000 mL | Freq: Two times a day (BID) | INTRAMUSCULAR | Status: DC
  Administered 2015-05-04 – 2015-05-05 (×3): 3 mL via INTRAVENOUS

## 2015-05-04 MED ORDER — SODIUM CHLORIDE 0.9 % IJ SOLN: 3.0000 mL | INTRAMUSCULAR | Status: DC | PRN

## 2015-05-04 MED ORDER — FUROSEMIDE 20 MG PO TABS
20.0000 mg | ORAL_TABLET | Freq: Every day | ORAL | Status: DC
  Administered 2015-05-04 – 2015-05-05 (×2): 20 mg via ORAL
  Filled 2015-05-04 (×2): qty 1

## 2015-05-04 NOTE — ED Notes (Signed)
Dr. Willey Blade who is patients PCP and acting ED physician is aware of patients D-dimer of 0.92

## 2015-05-04 NOTE — ED Notes (Signed)
Patient c/o upper abd/chest pain. Per patient feels like indigestion. Patient states pain started Tuesday, since nausea and loss of appetite. Per patient pain did radiate " al little" into back last night but denies any radiation of pain at this time. p

## 2015-05-04 NOTE — ED Provider Notes (Signed)
Pt seen by dr. Willey Blade. Initially in the emergency department.  Pt not seen by ED physician  Milton Ferguson, MD 05/04/15 1215

## 2015-05-04 NOTE — ED Notes (Signed)
Report called to [facility] on [address], all questions answered

## 2015-05-04 NOTE — ED Notes (Signed)
VQ offered to Dr. Willey Blade and patient to rule out PE since patient can not receive dye for CT Angio until tomorrow. Dr. Willey Blade declined and stated he would order CT Angio to rule out PE tomorrow.

## 2015-05-04 NOTE — ED Notes (Signed)
Spoke with radiology, patient will have CT chest tomorrow (can only receive dye once every 24 hours). Willey Blade made aware.

## 2015-05-05 LAB — BASIC METABOLIC PANEL
Anion gap: 8 (ref 5–15)
BUN: 15 mg/dL (ref 6–20)
CO2: 28 mmol/L (ref 22–32)
Calcium: 8.5 mg/dL — ABNORMAL LOW (ref 8.9–10.3)
Chloride: 97 mmol/L — ABNORMAL LOW (ref 101–111)
Creatinine, Ser: 0.91 mg/dL (ref 0.44–1.00)
GFR calc Af Amer: >60 mL/min (ref >60)
GFR calc non Af Amer: 52 mL/min — ABNORMAL LOW (ref >60)
GLUCOSE: 111 mg/dL — AB (ref 65–99)
POTASSIUM: 4.3 mmol/L (ref 3.5–5.1)
Sodium: 133 mmol/L — ABNORMAL LOW (ref 135–145)

## 2015-05-05 LAB — CBC
HEMATOCRIT: 40.5 % (ref 36.0–46.0)
Hemoglobin: 13.6 g/dL (ref 12.0–15.0)
MCH: 32.5 pg (ref 26.0–34.0)
MCHC: 33.6 g/dL (ref 30.0–36.0)
MCV: 96.9 fL (ref 78.0–100.0)
Platelets: 203 10*3/uL (ref 150–400)
RBC: 4.18 MIL/uL (ref 3.87–5.11)
RDW: 14.2 % (ref 11.5–15.5)
WBC: 9.3 10*3/uL (ref 4.0–10.5)

## 2015-05-05 LAB — TROPONIN I: TROPONIN I: 0.06 ng/mL — AB (ref <0.031)

## 2015-05-05 MED ORDER — HYDROCODONE-ACETAMINOPHEN 5-325 MG PO TABS: 1.0000 | ORAL_TABLET | Freq: Four times a day (QID) | ORAL | Status: DC | PRN

## 2015-05-05 MED ORDER — PANTOPRAZOLE SODIUM 40 MG PO TBEC: 40.0000 mg | DELAYED_RELEASE_TABLET | Freq: Every day | ORAL | Status: DC

## 2015-05-05 NOTE — H&P (Signed)
PCP:   Asencion Noble, MD   Chief Complaint:  Abdominal and chest pressure  HPI: This patient is a 81 year old female who presented to the emergency room with a four-day history of intermittent abdominal pain and chest pain. She described this as a pressure sensation with radiation to the right and left in the lower sternal and epigastric region. She had experienced loss of appetite and nausea. She denied vomiting. Last bowel movement was 2 days prior to admission. She felt she had indigestion and had tried Mylanta with no relief. She denied any increased symptoms with physical activity such as climbing stairs. She has stable chronic systolic heart failure and chronic atrial flutter. She is anticoagulated with Eliquis. She denied any bleeding or melena. There was no diaphoresis or increased shortness of breath at rest. She does not use anti-inflammatory drugs. She has a glass of wine several nights a week. She is a former smoker. She has had hypertension but no diabetes or hyperlipidemia. Glucose was elevated initially in the ER.    Past Medical History  Diagnosis Date  . Hypertension   . Fluttering heart   . Pancreatitis, acute   . Cancer    chronic systolic heart failure. Chronic atrial flutter Macular degeneration Chronic back pain   Past Surgical History  Procedure Laterality Date  . Cholecystectomy    . Breast surgery    . Tonsillectomy       Prior to Admission medications   Medication Sig Start Date End Date Taking? Authorizing Provider  benazepril (LOTENSIN) 10 MG tablet Take 1 tablet by mouth daily. 04/26/15  Yes Historical Provider, MD  beta carotene w/minerals (OCUVITE) tablet Take 1 tablet by mouth daily.   Yes Historical Provider, MD  ELIQUIS 2.5 MG TABS tablet Take 1 tablet by mouth 2 (two) times daily. 02/27/15  Yes Historical Provider, MD  fluticasone (FLONASE) 50 MCG/ACT nasal spray 2 sprays in each nostril at bedtime Patient taking differently:  Place 2 sprays into the nose at bedtime as needed for allergies. 2 sprays in each nostril at bedtime 05/16/14  Yes Farrel Gobble, MD  furosemide (LASIX) 20 MG tablet Take 20 mg by mouth daily.   Yes Historical Provider, MD  gabapentin (NEURONTIN) 100 MG capsule Take 100 mg by mouth 2 (two) times daily.    Yes Historical Provider, MD  metoprolol (LOPRESSOR) 50 MG tablet Take 1 tablet by mouth 2 (two) times daily. 10/06/13  Yes Historical Provider, MD  potassium chloride SA (K-DUR,KLOR-CON) 20 MEQ tablet Take 20 mEq by mouth 2 (two) times daily.   Yes Historical Provider, MD  HYDROcodone-acetaminophen (NORCO/VICODIN) 5-325 MG per tablet Take 1 tablet by mouth every 6 (six) hours as needed for moderate pain. 05/05/15   Asencion Noble, MD  pantoprazole (PROTONIX) 40 MG tablet Take 1 tablet (40 mg total) by mouth daily. 05/05/15   Asencion Noble, MD      Allergies:  No Known Allergies   Social History:  reports that she has quit smoking. Her smoking use included Cigarettes. She has never used smokeless tobacco. She reports that she drinks about 5.4 oz of alcohol per week. She reports that she does not use illicit drugs.  Family History  Problem Relation Age of Onset  . Hypertension Mother   . Cancer Paternal Aunt     Review of Systems:  _0 _1  later gave a history of some recent dysphagia with a  sense of bubbling in her throat after swallowing liquids. She does not have pain on swallowing. She has not had fever or chills. No recent trauma, falls or syncope.  Physical Exam: Blood pressure 117/65, pulse 93, temperature 98.4 F (36.9 C), temperature source Oral, resp. rate 16, height _0  (1.6 m), weight 136 lb 6.4 oz (61.871 kg), SpO2 91 %. Alert. Breathing comfortably. Fully oriented. Cognitively intact. HEENT: No scleral icterus. Post cataract surgery changes of the eyes noted. Oropharynx unremarkable. Neck supple with no JVD, thyromegaly, lymphadenopathy or bruit. Lungs clear. Heart irregular  with a rate in the 90s on telemetry. Chest wall reveals tenderness over the lower sternum. Abdomen is soft and nondistended. Tenderness is present in the epigastrium. No hepatosplenomegaly. No palpable mass. Bowel sounds present. Extremities reveal normal pulses with no clubbing or edema. Neuro exam reveals no focal weakness. Gait at baseline. Lymph nodes reveal no enlargement. Skin warm and dry.  Labs on Admission:  Results for orders placed or performed during the hospital encounter of 05/04/15 (from the past 48 hour(s))  CBC with Differential/Platelet     Status: Abnormal   Collection Time: 05/04/15 10:20 AM  Result Value Ref Range   WBC 14.4 (H) 4.0 - 10.5 K/uL   RBC 4.73 3.87 - 5.11 MIL/uL   Hemoglobin 15.3 (H) 12.0 - 15.0 g/dL   HCT 45.4 36.0 - 46.0 %   MCV 96.0 78.0 - 100.0 fL   MCH 32.3 26.0 - 34.0 pg   MCHC 33.7 30.0 - 36.0 g/dL   RDW 13.8 11.5 - 15.5 %   Platelets 217 150 - 400 K/uL   Neutrophils Relative % 86 (H) 43 - 77 %   Neutro Abs 12.4 (H) 1.7 - 7.7 K/uL   Lymphocytes Relative 8 (L) 12 - 46 %   Lymphs Abs 1.1 0.7 - 4.0 K/uL   Monocytes Relative 6 3 - 12 %   Monocytes Absolute 0.9 0.1 - 1.0 K/uL   Eosinophils Relative 0 0 - 5 %   Eosinophils Absolute 0.0 0.0 - 0.7 K/uL   Basophils Relative 0 0 - 1 %   Basophils Absolute 0.0 0.0 - 0.1 K/uL  Comprehensive metabolic panel     Status: Abnormal   Collection Time: 05/04/15 10:20 AM  Result Value Ref Range   Sodium 133 (L) 135 - 145 mmol/L   Potassium 3.6 3.5 - 5.1 mmol/L   Chloride 94 (L) 101 - 111 mmol/L   CO2 26 22 - 32 mmol/L   Glucose, Bld 214 (H) 65 - 99 mg/dL   BUN 11 6 - 20 mg/dL   Creatinine, Ser 0.87 0.44 - 1.00 mg/dL   Calcium 9.2 8.9 - 10.3 mg/dL   Total Protein 7.7 6.5 - 8.1 g/dL   Albumin 4.2 3.5 - 5.0 g/dL   AST 26 15 - 41 U/L   ALT 16 14 - 54 U/L   Alkaline Phosphatase 81 38 - 126 U/L   Total Bilirubin 1.7 (H) 0.3 - 1.2 mg/dL   GFR calc non Af Amer 55 (L) >60 mL/min   GFR calc Af Amer >60 >60 mL/min     Comment: (NOTE) The eGFR has been calculated using the CKD EPI equation. This calculation has not been validated in all clinical situations. eGFR's persistently <60 mL/min signify possible Chronic Kidney Disease.    Anion gap 13 5 - 15  Troponin I     Status: None   Collection Time: 05/04/15 10:20 AM  Result Value Ref Range  Troponin I 0.03 <0.031 ng/mL    Comment:        NO INDICATION OF MYOCARDIAL INJURY.   Brain natriuretic peptide     Status: Abnormal   Collection Time: 05/04/15 10:20 AM  Result Value Ref Range   B Natriuretic Peptide 974.0 (H) 0.0 - 100.0 pg/mL  Lipase, blood     Status: None   Collection Time: 05/04/15 10:20 AM  Result Value Ref Range   Lipase 46 22 - 51 U/L  D-dimer, quantitative (not at [facility])     Status: Abnormal   Collection Time: 05/04/15 10:20 AM  Result Value Ref Range   D-Dimer, Quant 0.92 (H) 0.00 - 0.48 ug/mL-FEU    Comment:        AT THE INHOUSE ESTABLISHED CUTOFF VALUE OF 0.48 ug/mL FEU, THIS ASSAY HAS BEEN DOCUMENTED IN THE LITERATURE TO HAVE A SENSITIVITY AND NEGATIVE PREDICTIVE VALUE OF AT LEAST 98 TO 99%.  THE TEST RESULT SHOULD BE CORRELATED WITH AN ASSESSMENT OF THE CLINICAL PROBABILITY OF DVT / VTE.   Troponin I     Status: Abnormal   Collection Time: 05/05/15  6:17 AM  Result Value Ref Range   Troponin I 0.06 (H) <0.031 ng/mL    Comment:        PERSISTENTLY INCREASED TROPONIN VALUES IN THE RANGE OF 0.04-0.49 ng/mL CAN BE SEEN IN:       -UNSTABLE ANGINA       -CONGESTIVE HEART FAILURE       -MYOCARDITIS       -CHEST TRAUMA       -ARRYHTHMIAS       -LATE PRESENTING MYOCARDIAL INFARCTION       -COPD   CLINICAL FOLLOW-UP RECOMMENDED.   Basic metabolic panel     Status: Abnormal   Collection Time: 05/05/15  6:17 AM  Result Value Ref Range   Sodium 133 (L) 135 - 145 mmol/L   Potassium 4.3 3.5 - 5.1 mmol/L   Chloride 97 (L) 101 - 111 mmol/L   CO2 28 22 - 32 mmol/L   Glucose, Bld 111 (H) 65 - 99 mg/dL   BUN 15 6 -  20 mg/dL   Creatinine, Ser 0.91 0.44 - 1.00 mg/dL   Calcium 8.5 (L) 8.9 - 10.3 mg/dL   GFR calc non Af Amer 52 (L) >60 mL/min   GFR calc Af Amer >60 >60 mL/min    Comment: (NOTE) The eGFR has been calculated using the CKD EPI equation. This calculation has not been validated in all clinical situations. eGFR's persistently <60 mL/min signify possible Chronic Kidney Disease.    Anion gap 8 5 - 15  CBC     Status: None   Collection Time: 05/05/15  6:17 AM  Result Value Ref Range   WBC 9.3 4.0 - 10.5 K/uL   RBC 4.18 3.87 - 5.11 MIL/uL   Hemoglobin 13.6 12.0 - 15.0 g/dL   HCT 40.5 36.0 - 46.0 %   MCV 96.9 78.0 - 100.0 fL   MCH 32.5 26.0 - 34.0 pg   MCHC 33.6 30.0 - 36.0 g/dL   RDW 14.2 11.5 - 15.5 %   Platelets 203 150 - 400 K/uL    Radiological Exams on Admission: Dg Chest 2 View  05/04/2015   CLINICAL DATA:  Shortness of breath and upper abdominal pain for 5 days.  EXAM: CHEST  2 VIEW  COMPARISON:  And lateral chest 09/13/2012 and 10/24/2013. CT chest 09/20/2012.  FINDINGS: The chest is hyperexpanded with  distortion of the pulmonary architecture consistent with emphysema. There is cardiomegaly. No consolidative process or evidence of pulmonary edema is identified. There is no effusion. No focal bony abnormality is seen.  IMPRESSION: No acute disease.  Emphysema and cardiomegaly.   Electronically Signed   By: Inge Rise M.D.   On: 05/04/2015 11:02   Ct Abdomen Pelvis W Contrast  05/04/2015   CLINICAL DATA:  Upper abdominal and chest pain for 5 days. Nausea and loss of appetite. Initial encounter.  EXAM: CT ABDOMEN AND PELVIS WITH CONTRAST  TECHNIQUE: Multidetector CT imaging of the abdomen and pelvis was performed using the standard protocol following bolus administration of intravenous contrast.  CONTRAST:  25 mL OMNIPAQUE IOHEXOL 300 MG/ML SOLN, 100 mL OMNIPAQUE IOHEXOL 300 MG/ML SOLN  COMPARISON:  None.  FINDINGS: The lung bases are emphysematous. A 0.7 cm subpleural nodule right  middle lobe is seen on image 8. There is cardiomegaly. No pleural or pericardial effusion.  The liver is low attenuating consistent with fatty infiltration. Calcification in the liver is consistent with old granulomatous disease. The spleen, adrenal glands and left kidney are unremarkable. The right kidney has an abnormal axis orientation with a small cyst identified.  Retroaortic left renal vein is noted. Aortoiliac atherosclerosis without aneurysm is seen. Uterus, adnexa and urinary bladder appear normal. There are few sigmoid diverticula without evidence of diverticulitis. The stomach, small bowel and appendix appear normal. No lymphadenopathy or fluid is identified.  The patient has convex left scoliosis and advanced multilevel spondylosis. No lytic or sclerotic bony lesion is identified.  IMPRESSION: No acute abnormality.  Cardiomegaly.  0.6 cm subpleural nodule right middle lobe. If the patient is at high risk for bronchogenic carcinoma, follow-up chest CT at 6-12 months is recommended. If the patient is at low risk for bronchogenic carcinoma, follow-up chest CT at 12 months is recommended. This recommendation follows the consensus statement: Guidelines for Management of Small Pulmonary Nodules Detected on CT Scans: A Statement from the [place] as published in Radiology 2005;237:395-400.  Fatty infiltration of the liver.  Atherosclerosis.  Mild sigmoid diverticulosis without diverticulitis.   Electronically Signed   By: Inge Rise M.D.   On: 05/04/2015 12:58   Dg Abd 2 Views  05/04/2015   CLINICAL DATA:  Upper abdominal pain.  Nausea and loss of appetite.  EXAM: ABDOMEN - 2 VIEW  COMPARISON:  None.  FINDINGS: The bowel gas pattern is normal and shows no evidence of obstruction or ileus. Clips are seen related to prior cholecystectomy. No free air is identified. No soft tissue abnormalities are seen. No abnormal calcifications identified. The lumbar spine demonstrates advanced spondylosis  with a mild leftward convex scoliosis.  IMPRESSION: Unremarkable abdominal films.   Electronically Signed   By: Aletta Edouard M.D.   On: 05/04/2015 11:03    Assessment/Plan #1. Chest/abdominal pain. Initial troponin is negative. EKG reveals atrial flutter with a left bundle branch block which is chronic. Chest x-ray reveals no acute infiltrate or mass. Cardiomegaly is present. She'll be hospitalized for observation overnight. Troponin will be repeated. Abdominal tenderness has been evaluated by CT which reveals no acute abnormality. She has atherosclerosis of the aorta but no aneurysm. She is status post cholecystectomy. She'll be treated empirically with pantoprazole. LFTs and lipase are normal. She has a leukocytosis of unclear etiology but no fever. Repeat CBC. She has a mildly elevated d-dimer but is anticoagulated with Eliquis. #2. Emphysema.  Oxygen saturation now is 91% on room air. She had  some readings in the high 80s initially. Oxygen has been initiated by nasal cannula. #3. Chronic systolic heart failure. Stable. Continue Lasix, benazepril and metoprolol. #4. Chronic atrial flutter. Continue metoprolol for rate control. Continue anticoagulation. #5. Atherosclerosis of the aorta. #6. Lumbar spondylosis/scoliosis. #7. Macular degeneration. #8. Mild hyponatremia. #9. Right middle lobe pulmonary nodule. Repeat CT in 6 months. #10. Hyperglycemia. Repeat much improved. Will follow. Active Problems:   Chest pain    [name]    05/05/2015

## 2015-05-05 NOTE — Progress Notes (Signed)
Patient with orders to be discharge home. Discharge instructions given, patient verbalized understanding. Prescriptions given. Patient stable. Patient left in private vehicle with family.  

## 2015-05-05 NOTE — Discharge Summary (Signed)
Physician Discharge Summary  ANELLA NAKATA KXF:[id number] DOB: [birth year] DOA: 05/04/2015   Admit date: 05/04/2015 Discharge date: 05/05/2015  Discharge Diagnoses: #1. Chest and abdominal pain. #2. Chronic systolic heart failure. #3. Chronic atrial flutter. #4. Emphysema. #5. 0.6 cm right pulmonary nodule. #6. Atherosclerosis of the aorta. #7. Lumbar spondylosis/scoliosis. #8. Hypertension. #9. Hyperglycemia. Active Problems:   Chest pain    Wt Readings from Last 3 Encounters:  05/05/15 136 lb 6.4 oz (61.871 kg)  05/16/14 138 lb (62.596 kg)  10/27/13 136 lb 7.4 oz (61.9 kg)     Hospital Course:  This patient is a 81 year old female who presented with pain in her lower sternal and epigastric areas over the previous 4 days. There was tenderness of the lower sternum and epigastrium on exam. Showed a mild leukocytosis which normalized on recheck. She had no fever. EKG revealed atrial flutter which is chronic and left bundle branch block which is old. Her initial troponin was 0.03 with a repeat at 0.06. Chest x-ray revealed emphysema and cardiomegaly. She has a history of chronic systolic heart failure which has been stable on Lasix, benazepril and metoprolol. She is anticoagulated with Eliquis. She had a mildly elevated d-dimer of 0.92.  Pain was treated with morphine and Zofran. Symptoms resolved after hospitalization. She was pain free overnight and feeling well on 7/24. She had mild residual tenderness of the lower sternum and epigastrium. CT scan of the abdomen revealed no acute process. She was found to have a 0.6 cm right pulmonary nodule which will be reassessed by CT in 6 months. She had some hepatic steatosis and atherosclerosis of the aorta and lumbar spondylosis. Lipase and LFTs were normal. She was initially hyperglycemic initially in the 200 range however her recheck was in the lower 100 range. Hypertension has been controlled. Oxygen saturations initially fluctuated between the  low 90s and upper 80s but are now in the low 90s prior to discharge.  She subsequently gave a history of some recent dysphagia also. She'll be treated with pantoprazole and further assessed with upper endoscopy or upper GI series if symptoms are not resolving. Condition at discharge is much improved. Follow-up will be in my office in one week.   Discharge Instructions     Medication List    STOP taking these medications        fluticasone 50 MCG/ACT nasal spray  Commonly known as:  FLONASE      TAKE these medications        benazepril 10 MG tablet  Commonly known as:  LOTENSIN  Take 1 tablet by mouth daily.     beta carotene w/minerals tablet  Take 1 tablet by mouth daily.     ELIQUIS 2.5 MG Tabs tablet  Generic drug:  apixaban  Take 1 tablet by mouth 2 (two) times daily.     furosemide 20 MG tablet  Commonly known as:  LASIX  Take 20 mg by mouth daily.     gabapentin 100 MG capsule  Commonly known as:  NEURONTIN  Take 100 mg by mouth 2 (two) times daily.     HYDROcodone-acetaminophen 5-325 MG per tablet  Commonly known as:  NORCO/VICODIN  Take 1 tablet by mouth every 6 (six) hours as needed for moderate pain.     metoprolol 50 MG tablet  Commonly known as:  LOPRESSOR  Take 1 tablet by mouth 2 (two) times daily.     pantoprazole 40 MG tablet  Commonly known as:  PROTONIX  Take 1  tablet (40 mg total) by mouth daily.     potassium chloride SA 20 MEQ tablet  Commonly known as:  K-DUR,KLOR-CON  Take 20 mEq by mouth 2 (two) times daily.         [name] 05/05/2015

## 2015-05-17 ENCOUNTER — Ambulatory Visit (HOSPITAL_COMMUNITY): Payer: Medicare FFS | Admitting: Hematology & Oncology

## 2015-05-17 ENCOUNTER — Encounter (HOSPITAL_COMMUNITY): Payer: Self-pay | Admitting: Hematology & Oncology

## 2015-05-17 ENCOUNTER — Other Ambulatory Visit (HOSPITAL_COMMUNITY): Payer: Medicare FFS

## 2015-05-17 ENCOUNTER — Encounter (HOSPITAL_COMMUNITY): Payer: Medicare FFS | Admitting: Hematology & Oncology

## 2015-06-22 NOTE — Progress Notes (Signed)
This encounter was created in error - please disregard.

## 2015-08-13 ENCOUNTER — Encounter: Payer: Self-pay | Admitting: *Deleted

## 2015-08-21 ENCOUNTER — Encounter: Payer: Self-pay | Admitting: Cardiovascular Disease

## 2015-08-21 ENCOUNTER — Encounter: Payer: Self-pay | Admitting: *Deleted

## 2015-08-21 ENCOUNTER — Ambulatory Visit (INDEPENDENT_AMBULATORY_CARE_PROVIDER_SITE_OTHER): Payer: Medicare FFS | Admitting: Cardiovascular Disease

## 2015-08-21 VITALS — BP 132/86 | HR 75 | Ht 63.0 in | Wt 137.0 lb

## 2015-08-21 DIAGNOSIS — Z136 Encounter for screening for cardiovascular disorders: Secondary | ICD-10-CM | POA: Diagnosis not present

## 2015-08-21 DIAGNOSIS — R0602 Shortness of breath: Secondary | ICD-10-CM

## 2015-08-21 DIAGNOSIS — R0601 Orthopnea: Secondary | ICD-10-CM | POA: Diagnosis not present

## 2015-08-21 DIAGNOSIS — I10 Essential (primary) hypertension: Secondary | ICD-10-CM

## 2015-08-21 DIAGNOSIS — I447 Left bundle-branch block, unspecified: Secondary | ICD-10-CM

## 2015-08-21 DIAGNOSIS — I429 Cardiomyopathy, unspecified: Secondary | ICD-10-CM

## 2015-08-21 DIAGNOSIS — I4892 Unspecified atrial flutter: Secondary | ICD-10-CM

## 2015-08-21 NOTE — Progress Notes (Signed)
Patient ID: TOIA MICALE, female   DOB: August 03, 1917, 81 y.o.   MRN: [id number]       CARDIOLOGY CONSULT NOTE  Patient ID: LUCIA HARM MRN: [id number] DOB/AGE: [birth year] 81 y.o.  Admit date: (Not on file) Primary Physician Asencion Noble, MD  Reason for Consultation: chest pain, atrial flutter  HPI: The patient is a 81 year old woman with a past medical history significant for hypertension, chronic systolic heart failure, LBBB, and atrial flutter who has been experiencing chest pain and exertional dyspnea. She takes metoprolol and Eliquis and has not had any bleeding complications.  She was hospitalized for chest pain in July 2016. troponins were 0.03 and 0.06. Chest x-ray showed emphysema and cardiomegaly. Abdominal CT showed atherosclerosis of the aortoiliac region.  Most recent nuclear stress test I find is from 05/05/05 which was normal.  Most recent echocardiogram I find is dated 06/20/13 which shows severely reduced left ventricular systolic function, EF 97-67% , moderate left atrial dilatation, mild right ventricular dilatation, mildly reduced right ventricular systolic function, mild to moderate right atrial dilatation, and an atrial septal aneurysm with increased left atrial pressures and a small PFO with left-to-right flow.  ECG from 04/2015 showed atrial flutter and a LBBB.  She has recently been experiencing orthopnea on a near nightly basis which has required that she sit in a chair for the rest of the night. She now requires a hospital bed to raise her head above 30 to prevent recurrent dyspnea. Denies chest pain and leg swelling. No syncope.    No Known Allergies  Current Outpatient Prescriptions  Medication Sig Dispense Refill  . benazepril (LOTENSIN) 10 MG tablet Take 1 tablet by mouth daily.  0  . beta carotene w/minerals (OCUVITE) tablet Take 1 tablet by mouth daily.    Marland Kitchen ELIQUIS 2.5 MG TABS tablet Take 1 tablet by mouth 2 (two) times daily.    . furosemide  (LASIX) 20 MG tablet Take 20 mg by mouth daily.    Marland Kitchen gabapentin (NEURONTIN) 100 MG capsule Take 100 mg by mouth 2 (two) times daily.     . metoprolol (LOPRESSOR) 50 MG tablet Take 1 tablet by mouth 2 (two) times daily.    . potassium chloride SA (K-DUR,KLOR-CON) 20 MEQ tablet Take 20 mEq by mouth 2 (two) times daily.    . traZODone (DESYREL) 50 MG tablet Take 25-50 mg by mouth at bedtime as needed.  0   No current facility-administered medications for this visit.    Past Medical History  Diagnosis Date  . Hypertension   . Fluttering heart ([place])   . Pancreatitis, acute   . Cancer [facility])     Past Surgical History  Procedure Laterality Date  . Cholecystectomy    . Breast surgery    . Tonsillectomy      Social History   Social History  . Marital Status: Single    Spouse Name: N/A  . Number of Children: N/A  . Years of Education: N/A   Occupational History  . Not on file.   Social History Main Topics  . Smoking status: Former Smoker -- 0.50 packs/day for 40 years    Types: Cigarettes    Start date: 09/03/1942    Quit date: 09/03/1982  . Smokeless tobacco: Never Used  . Alcohol Use: 5.4 oz/week    9 Glasses of wine per week     Comment: wine  . Drug Use: No  . Sexual Activity: Not on file   Other Topics Concern  .  Not on file   Social History Narrative     No family history of premature CAD in 1st degree relatives.  Prior to Admission medications   Medication Sig Start Date End Date Taking? Authorizing Provider  benazepril (LOTENSIN) 10 MG tablet Take 1 tablet by mouth daily. 04/26/15   Historical Provider, MD  beta carotene w/minerals (OCUVITE) tablet Take 1 tablet by mouth daily.    Historical Provider, MD  ELIQUIS 2.5 MG TABS tablet Take 1 tablet by mouth 2 (two) times daily. 02/27/15   Historical Provider, MD  furosemide (LASIX) 20 MG tablet Take 20 mg by mouth daily.    Historical Provider, MD  gabapentin (NEURONTIN) 100 MG capsule Take 100 mg by mouth 2  (two) times daily.     Historical Provider, MD  HYDROcodone-acetaminophen (NORCO/VICODIN) 5-325 MG per tablet Take 1 tablet by mouth every 6 (six) hours as needed for moderate pain. 05/05/15   Asencion Noble, MD  metoprolol (LOPRESSOR) 50 MG tablet Take 1 tablet by mouth 2 (two) times daily. 10/06/13   Historical Provider, MD  pantoprazole (PROTONIX) 40 MG tablet Take 1 tablet (40 mg total) by mouth daily. 05/05/15   Asencion Noble, MD  potassium chloride SA (K-DUR,KLOR-CON) 20 MEQ tablet Take 20 mEq by mouth 2 (two) times daily.    Historical Provider, MD  traZODone (DESYREL) 50 MG tablet Take 25-50 mg by mouth at bedtime as needed. 04/02/15   Historical Provider, MD     Review of systems complete and found to be negative unless listed above in HPI   Physical exam Blood pressure 132/86, pulse 75, height 5\' 3"  (1.6 m), weight 137 lb (62.143 kg). General: NAD Neck: No JVD, no thyromegaly or thyroid nodule.  Lungs: Clear to auscultation bilaterally with normal respiratory effort. CV: Nondisplaced PMI. Regular rate and rhythm, normal S1/S2, no S3/S4, no murmur.  No peripheral edema.   Abdomen: Soft, nontender, no distention.  Skin: Intact without lesions or rashes.  Neurologic: Alert and oriented x 3.  Psych: Normal affect. Extremities: No clubbing or cyanosis.  HEENT: Normal.   ECG: Most recent ECG reviewed.  Labs:   Lab Results  Component Value Date   WBC 9.3 05/05/2015   HGB 13.6 05/05/2015   HCT 40.5 05/05/2015   MCV 96.9 05/05/2015   PLT 203 05/05/2015   No results for input(s): NA, K, CL, CO2, BUN, CREATININE, CALCIUM, PROT, BILITOT, ALKPHOS, ALT, AST, GLUCOSE in the last 168 hours.  Invalid input(s): LABALBU Lab Results  Component Value Date   TROPONINI 0.06* 05/05/2015   No results found for: CHOL No results found for: HDL No results found for: LDLCALC No results found for: TRIG No results found for: CHOLHDL No results found for: LDLDIRECT       Studies: No results  found.  ASSESSMENT AND PLAN:  1. Orthopnea and progressive exertional dyspnea in the context of chronic systolic heart failure: Will repeat echocardiogram to assess for interval change in LV systolic function. Will also obtain a Lexiscan Cardiolite stress test to assess for an ischemic etiology. I will also check a BNP. No leg swelling or pulmonary edema. Will keep Lasix at 20 mg daily.  2. Chronic atrial flutter: HR controlled on metoprolol. Anticoagulated with Eliquis. No changes.  3. Essential HTN: Controlled. No changes.  4. LBBB: Chronic.  Dispo: f/u 2 weeks.   Signed: Kate Sable, M.D., F.A.C.C.  08/21/2015, 1:50 PM

## 2015-08-21 NOTE — Patient Instructions (Signed)
Your physician has requested that you have a lexiscan myoview. For further information please visit [email or web address]. Please follow instruction sheet, as given. Your physician has requested that you have an echocardiogram. Echocardiography is a painless test that uses sound waves to create images of your heart. It provides your doctor with information about the size and shape of your heart and how well your heart's chambers and valves are working. This procedure takes approximately one hour. There are no restrictions for this procedure. Lab for BNP - order given today. Office will contact with results via phone or letter.   Continue all current medications. Follow up in  2 weeks.

## 2015-08-27 ENCOUNTER — Encounter (HOSPITAL_COMMUNITY)
Admission: RE | Admit: 2015-08-27 | Discharge: 2015-08-27 | Disposition: A | Discharge status: Home / Self Care | Payer: Medicare FFS | Source: Ambulatory Visit | Attending: Cardiovascular Disease | Admitting: Cardiovascular Disease

## 2015-08-27 ENCOUNTER — Encounter (HOSPITAL_COMMUNITY): Payer: Self-pay

## 2015-08-27 ENCOUNTER — Other Ambulatory Visit (HOSPITAL_COMMUNITY): Payer: Medicare FFS

## 2015-08-27 ENCOUNTER — Inpatient Hospital Stay (HOSPITAL_COMMUNITY): Admission: RE | Admit: 2015-08-27 | Payer: Medicare FFS | Source: Ambulatory Visit

## 2015-08-27 DIAGNOSIS — R0602 Shortness of breath: Secondary | ICD-10-CM | POA: Diagnosis not present

## 2015-08-27 DIAGNOSIS — I519 Heart disease, unspecified: Secondary | ICD-10-CM | POA: Diagnosis present

## 2015-08-27 DIAGNOSIS — I429 Cardiomyopathy, unspecified: Secondary | ICD-10-CM | POA: Insufficient documentation

## 2015-08-27 DIAGNOSIS — I447 Left bundle-branch block, unspecified: Secondary | ICD-10-CM | POA: Insufficient documentation

## 2015-08-27 DIAGNOSIS — R0601 Orthopnea: Secondary | ICD-10-CM | POA: Insufficient documentation

## 2015-08-27 LAB — NM MYOCAR MULTI W/SPECT W/WALL MOTION / EF
CSEPPHR: 82 {beats}/min
LV dias vol: 76 mL
LVSYSVOL: 52 mL
RATE: 0.32
Rest HR: 78 {beats}/min
SDS: 0
SRS: 4
SSS: 4
TID: 1.02

## 2015-08-27 MED ORDER — TECHNETIUM TC 99M SESTAMIBI GENERIC - CARDIOLITE
30.0000 | Freq: Once | INTRAVENOUS | Status: AC | PRN
  Administered 2015-08-27: 28 via INTRAVENOUS

## 2015-08-27 MED ORDER — REGADENOSON 0.4 MG/5ML IV SOLN
INTRAVENOUS | Status: AC
  Administered 2015-08-27: 0.4 mg via INTRAVENOUS
  Filled 2015-08-27: qty 5

## 2015-08-27 MED ORDER — SODIUM CHLORIDE 0.9 % IJ SOLN
INTRAMUSCULAR | Status: AC
  Administered 2015-08-27: 10 mL via INTRAVENOUS
  Filled 2015-08-27: qty 3

## 2015-08-27 MED ORDER — TECHNETIUM TC 99M SESTAMIBI - CARDIOLITE
10.0000 | Freq: Once | INTRAVENOUS | Status: AC | PRN
  Administered 2015-08-27: 9 via INTRAVENOUS

## 2015-08-30 ENCOUNTER — Ambulatory Visit (HOSPITAL_COMMUNITY)
Admission: RE | Admit: 2015-08-30 | Discharge: 2015-08-30 | Disposition: A | Discharge status: Home / Self Care | Payer: Medicare FFS | Source: Ambulatory Visit | Attending: Cardiovascular Disease | Admitting: Cardiovascular Disease

## 2015-08-30 DIAGNOSIS — R0602 Shortness of breath: Secondary | ICD-10-CM

## 2015-08-30 DIAGNOSIS — R0601 Orthopnea: Secondary | ICD-10-CM | POA: Diagnosis not present

## 2015-08-30 DIAGNOSIS — I358 Other nonrheumatic aortic valve disorders: Secondary | ICD-10-CM | POA: Insufficient documentation

## 2015-08-30 DIAGNOSIS — I371 Nonrheumatic pulmonary valve insufficiency: Secondary | ICD-10-CM | POA: Diagnosis not present

## 2015-08-30 DIAGNOSIS — I071 Rheumatic tricuspid insufficiency: Secondary | ICD-10-CM | POA: Insufficient documentation

## 2015-08-30 DIAGNOSIS — I313 Pericardial effusion (noninflammatory): Secondary | ICD-10-CM | POA: Diagnosis not present

## 2015-08-30 DIAGNOSIS — I517 Cardiomegaly: Secondary | ICD-10-CM | POA: Diagnosis not present

## 2015-08-30 DIAGNOSIS — I429 Cardiomyopathy, unspecified: Secondary | ICD-10-CM | POA: Diagnosis not present

## 2015-08-30 DIAGNOSIS — I340 Nonrheumatic mitral (valve) insufficiency: Secondary | ICD-10-CM | POA: Insufficient documentation

## 2015-08-30 DIAGNOSIS — I447 Left bundle-branch block, unspecified: Secondary | ICD-10-CM | POA: Insufficient documentation

## 2015-09-04 ENCOUNTER — Telehealth: Payer: Self-pay | Admitting: *Deleted

## 2015-09-04 NOTE — Telephone Encounter (Signed)
-----   Message from Herminio Commons, MD sent at 08/27/2015  4:59 PM EST ----- No blockages.

## 2015-09-04 NOTE — Telephone Encounter (Signed)
Notes Recorded by Laurine Blazer, LPN on [id number] at 2:40 PM Patient notified. Copy to pmd. Already has follow up scheduled 09/11/15 with Dr. Bronson Ing - [facility].

## 2015-09-11 ENCOUNTER — Encounter: Payer: Self-pay | Admitting: Cardiovascular Disease

## 2015-09-11 ENCOUNTER — Ambulatory Visit (INDEPENDENT_AMBULATORY_CARE_PROVIDER_SITE_OTHER): Payer: Medicare FFS | Admitting: Cardiovascular Disease

## 2015-09-11 VITALS — BP 120/70 | HR 85 | Ht 63.0 in | Wt 133.0 lb

## 2015-09-11 DIAGNOSIS — I10 Essential (primary) hypertension: Secondary | ICD-10-CM

## 2015-09-11 DIAGNOSIS — R0602 Shortness of breath: Secondary | ICD-10-CM

## 2015-09-11 DIAGNOSIS — I429 Cardiomyopathy, unspecified: Secondary | ICD-10-CM

## 2015-09-11 DIAGNOSIS — R0601 Orthopnea: Secondary | ICD-10-CM

## 2015-09-11 DIAGNOSIS — I447 Left bundle-branch block, unspecified: Secondary | ICD-10-CM

## 2015-09-11 DIAGNOSIS — I4892 Unspecified atrial flutter: Secondary | ICD-10-CM

## 2015-09-11 MED ORDER — FUROSEMIDE 40 MG PO TABS: 40.0000 mg | ORAL_TABLET | Freq: Every day | ORAL | Status: DC

## 2015-09-11 MED ORDER — METOPROLOL TARTRATE 50 MG PO TABS: 75.0000 mg | ORAL_TABLET | Freq: Two times a day (BID) | ORAL | Status: DC

## 2015-09-11 NOTE — Patient Instructions (Signed)
Your physician has recommended you make the following change in your medication:  Increase furosemide to 40 mg daily. You may take (2) of your 20 mg tablets daily until they are finished. Increase metoprolol tartrate to 75 mg twice daily. Please take 1&1/2 of your 50 mg tablets twice daily. Continue all other medications the same. Your physician recommends that you schedule a follow-up appointment in: 6 weeks.

## 2015-09-11 NOTE — Progress Notes (Signed)
Patient ID: [name], female   DOB: [birth year], 81 y.o.   MRN: [id number]      SUBJECTIVE: The patient returns for follow-up after undergoing cardiovascular testing performed for the evaluation of orthopnea and exertional dyspnea.  Nuclear stress test on 08/27/15 demonstrated normal myocardial perfusion.  Echocardiogram on 11/18 /16 demonstrated severely reduced left ventricular systolic function, LVEF [id number], diastolic dysfunction with restrictive physiology, mild LVH , mild mitral regurgitation, moderate biatrial dilatation, mild tricuspid and pulmonic regurgitation, and mild to moderately elevated pulmonary pressures, 44 mmHg.  Denies chest pain. Still short of breath with minimal exertion. Thinks Lasix is not working as well.  Review of Systems: As per "subjective", otherwise negative.  No Known Allergies  Current Outpatient Prescriptions  Medication Sig Dispense Refill  . benazepril (LOTENSIN) 10 MG tablet Take 1 tablet by mouth daily.  0  . beta carotene w/minerals (OCUVITE) tablet Take 1 tablet by mouth daily.    Marland Kitchen ELIQUIS 2.5 MG TABS tablet Take 1 tablet by mouth 2 (two) times daily.    . furosemide (LASIX) 20 MG tablet Take 20 mg by mouth daily.    Marland Kitchen gabapentin (NEURONTIN) 100 MG capsule Take 200 mg by mouth 3 (three) times daily.     . metoprolol (LOPRESSOR) 50 MG tablet Take 1 tablet by mouth 2 (two) times daily.    . potassium chloride SA (K-DUR,KLOR-CON) 20 MEQ tablet Take 20 mEq by mouth 2 (two) times daily.    . traZODone (DESYREL) 50 MG tablet Take 25-50 mg by mouth at bedtime as needed.  0   No current facility-administered medications for this visit.    Past Medical History  Diagnosis Date  . Hypertension   . Fluttering heart ([place])   . Pancreatitis, acute   . Cancer ([place])     Lt Breast, Skin    Past Surgical History  Procedure Laterality Date  . Cholecystectomy    . Breast surgery    . Tonsillectomy      Social History   Social History  .  Marital Status: Single    Spouse Name: N/A  . Number of Children: N/A  . Years of Education: N/A   Occupational History  . Not on file.   Social History Main Topics  . Smoking status: Former Smoker -- 0.50 packs/day for 40 years    Types: Cigarettes    Start date: 09/03/1942    Quit date: 09/03/1982  . Smokeless tobacco: Never Used  . Alcohol Use: 5.4 oz/week    9 Glasses of wine per week     Comment: wine  . Drug Use: No  . Sexual Activity: Not on file   Other Topics Concern  . Not on file   Social History Narrative     Filed Vitals:   09/11/15 1349  BP: 120/70  Pulse: 85  Height: 5\' 3"  (1.6 m)  Weight: 133 lb (60.328 kg)  SpO2: 90%    PHYSICAL EXAM General: NAD Neck: No JVD, no thyromegaly or thyroid nodule.  Lungs: Clear to auscultation bilaterally with normal respiratory effort. CV: HR at upper normal limits, slightly irregular, normal S1/S2, no S3/S4, no murmur. No peripheral edema.  Abdomen: Soft, nontender, no distention.  Skin: Intact without lesions or rashes.  Neurologic: Alert and oriented x 3.  Psych: Normal affect. Extremities: No clubbing or cyanosis.  HEENT: Normal.    ECG: Most recent ECG reviewed.      ASSESSMENT AND PLAN: 1. Orthopnea and progressive exertional dyspnea in the  context of chronic systolic heart failure: No ischemia by stress testing as noted above. LVEF remains severely reduced at 30% with no marked changes since 2014. Has restrictive diastolic physiology. No leg swelling or pulmonary edema. HR at upper normal limits so will increase metoprolol to 75 mg bid. Will increase Lasix to 40 mg daily.  2. Chronic atrial flutter: HR at upper normal limits so will increase metoprolol to 75 mg bid.. Anticoagulated with Eliquis.   3. Essential HTN: Controlled. No changes.  4. LBBB: Chronic.  Dispo: f/u 6 weeks.  Kate Sable, M.D., F.A.C.C.

## 2015-10-28 ENCOUNTER — Encounter: Payer: Self-pay | Admitting: Cardiovascular Disease

## 2015-10-28 ENCOUNTER — Ambulatory Visit (INDEPENDENT_AMBULATORY_CARE_PROVIDER_SITE_OTHER): Payer: Medicare FFS | Admitting: Cardiovascular Disease

## 2015-10-28 VITALS — BP 121/72 | HR 72 | Ht 63.0 in | Wt 128.0 lb

## 2015-10-28 DIAGNOSIS — I5022 Chronic systolic (congestive) heart failure: Secondary | ICD-10-CM

## 2015-10-28 DIAGNOSIS — I4892 Unspecified atrial flutter: Secondary | ICD-10-CM

## 2015-10-28 DIAGNOSIS — I10 Essential (primary) hypertension: Secondary | ICD-10-CM

## 2015-10-28 DIAGNOSIS — I447 Left bundle-branch block, unspecified: Secondary | ICD-10-CM | POA: Diagnosis not present

## 2015-10-28 DIAGNOSIS — I429 Cardiomyopathy, unspecified: Secondary | ICD-10-CM | POA: Diagnosis not present

## 2015-10-28 NOTE — Patient Instructions (Signed)
Continue all current medications. Your physician wants you to follow up in: 6 months.  You will receive a reminder letter in the mail one-two months in advance.  If you don't receive a letter, please call our office to schedule the follow up appointment   

## 2015-10-28 NOTE — Progress Notes (Signed)
Patient ID: [name], female   DOB: 07/03/1917, 82 y.o.   MRN: [id number]      SUBJECTIVE: The patient presents for routine follow up for chronic systolic heart failure. Lasix increased to 40 mg at last visit. Feeling better. No shortness of breath or palpitations. Denies chest pain.   Review of Systems: As per "subjective", otherwise negative.  No Known Allergies  Current Outpatient Prescriptions  Medication Sig Dispense Refill  . benazepril (LOTENSIN) 10 MG tablet Take 1 tablet by mouth daily.  0  . beta carotene w/minerals (OCUVITE) tablet Take 1 tablet by mouth 2 (two) times daily.     Marland Kitchen ELIQUIS 2.5 MG TABS tablet Take 1 tablet by mouth 2 (two) times daily.    . Fluticasone-Salmeterol (ADVAIR) 250-50 MCG/DOSE AEPB Inhale 1 puff into the lungs 2 (two) times daily.    . furosemide (LASIX) 40 MG tablet Take 1 tablet (40 mg total) by mouth daily. 90 tablet 3  . gabapentin (NEURONTIN) 100 MG capsule Take 200 mg by mouth 3 (three) times daily.     . metoprolol (LOPRESSOR) 50 MG tablet Take 1.5 tablets (75 mg total) by mouth 2 (two) times daily. 270 tablet 3  . potassium chloride SA (K-DUR,KLOR-CON) 20 MEQ tablet Take 20 mEq by mouth 2 (two) times daily.     No current facility-administered medications for this visit.    Past Medical History  Diagnosis Date  . Hypertension   . Fluttering heart ([place])   . Pancreatitis, acute   . Cancer ([place])     Lt Breast, Skin    Past Surgical History  Procedure Laterality Date  . Cholecystectomy    . Breast surgery    . Tonsillectomy      Social History   Social History  . Marital Status: Single    Spouse Name: N/A  . Number of Children: N/A  . Years of Education: N/A   Occupational History  . Not on file.   Social History Main Topics  . Smoking status: Former Smoker -- 0.50 packs/day for 40 years    Types: Cigarettes    Start date: 09/03/1942    Quit date: 09/03/1982  . Smokeless tobacco: Never Used  . Alcohol Use:  5.4 oz/week    9 Glasses of wine per week     Comment: wine  . Drug Use: No  . Sexual Activity: Not on file   Other Topics Concern  . Not on file   Social History Narrative     Filed Vitals:   10/28/15 1309  BP: 121/72  Pulse: 72  Height: 5\' 3"  (1.6 m)  Weight: 128 lb (58.06 kg)    PHYSICAL EXAM General: NAD HEENT: Normal. Neck: No JVD, no thyromegaly. Lungs: Clear to auscultation bilaterally with normal respiratory effort. CV: Nondisplaced PMI.  Regular rate and rhythm, normal S1/S2, no S3/S4, no murmur. No pretibial or periankle edema.   Abdomen: Soft, no distention.  Neurologic: Alert and oriented.  Psych: Normal affect. Skin: Normal. Musculoskeletal: No gross deformities. Extremities: No clubbing or cyanosis.   ECG: Most recent ECG reviewed.      ASSESSMENT AND PLAN: 1. Chronic systolic heart failure: Improved symptomatically. No ischemia by stress testing in 08/2015. LVEF remains severely reduced at 30% with no marked changes since 2014. Has restrictive diastolic physiology. No leg swelling or pulmonary edema. Continue metoprolol and Lasix at current doses.  2. Chronic atrial flutter: HR controlled on metoprolol 75 mg bid. Anticoagulated with Eliquis.   3.  Essential HTN: Controlled. No changes.  4. LBBB: Chronic.  Dispo: f/u 6 months.   Kate Sable, M.D., F.A.C.C.

## 2015-11-19 ENCOUNTER — Other Ambulatory Visit (HOSPITAL_COMMUNITY): Payer: Self-pay | Admitting: Internal Medicine

## 2015-11-19 DIAGNOSIS — R911 Solitary pulmonary nodule: Secondary | ICD-10-CM

## 2015-11-25 ENCOUNTER — Ambulatory Visit (HOSPITAL_COMMUNITY)
Admission: RE | Admit: 2015-11-25 | Discharge: 2015-11-25 | Disposition: A | Discharge status: Home / Self Care | Payer: Medicare FFS | Source: Ambulatory Visit | Attending: Internal Medicine | Admitting: Internal Medicine

## 2015-11-25 DIAGNOSIS — J984 Other disorders of lung: Secondary | ICD-10-CM | POA: Insufficient documentation

## 2015-11-25 DIAGNOSIS — I700 Atherosclerosis of aorta: Secondary | ICD-10-CM | POA: Diagnosis not present

## 2015-11-25 DIAGNOSIS — J439 Emphysema, unspecified: Secondary | ICD-10-CM | POA: Diagnosis not present

## 2015-11-25 DIAGNOSIS — R918 Other nonspecific abnormal finding of lung field: Secondary | ICD-10-CM | POA: Insufficient documentation

## 2015-11-25 DIAGNOSIS — I2510 Atherosclerotic heart disease of native coronary artery without angina pectoris: Secondary | ICD-10-CM | POA: Diagnosis not present

## 2015-11-25 DIAGNOSIS — I517 Cardiomegaly: Secondary | ICD-10-CM | POA: Diagnosis not present

## 2015-11-25 DIAGNOSIS — R911 Solitary pulmonary nodule: Secondary | ICD-10-CM | POA: Insufficient documentation

## 2016-04-29 ENCOUNTER — Encounter: Payer: Self-pay | Admitting: Cardiovascular Disease

## 2016-04-29 ENCOUNTER — Ambulatory Visit (INDEPENDENT_AMBULATORY_CARE_PROVIDER_SITE_OTHER): Payer: Medicare FFS | Admitting: Cardiovascular Disease

## 2016-04-29 ENCOUNTER — Encounter (INDEPENDENT_AMBULATORY_CARE_PROVIDER_SITE_OTHER): Payer: Self-pay

## 2016-04-29 VITALS — BP 104/52 | HR 81 | Ht 66.0 in | Wt 125.0 lb

## 2016-04-29 DIAGNOSIS — I447 Left bundle-branch block, unspecified: Secondary | ICD-10-CM

## 2016-04-29 DIAGNOSIS — R0602 Shortness of breath: Secondary | ICD-10-CM

## 2016-04-29 DIAGNOSIS — I5022 Chronic systolic (congestive) heart failure: Secondary | ICD-10-CM | POA: Diagnosis not present

## 2016-04-29 DIAGNOSIS — I4892 Unspecified atrial flutter: Secondary | ICD-10-CM

## 2016-04-29 DIAGNOSIS — I10 Essential (primary) hypertension: Secondary | ICD-10-CM

## 2016-04-29 DIAGNOSIS — I429 Cardiomyopathy, unspecified: Secondary | ICD-10-CM

## 2016-04-29 NOTE — Progress Notes (Signed)
Patient ID: [name], female   DOB: [birth year], 82 y.o.   MRN: [id number]      SUBJECTIVE: The patient presents for routine follow up for chronic systolic heart failure. Denies palpitations and bleeding problems. Denies dizziness and syncope. Her shortness of breath is stable. Denies leg swelling.  ECG performed in the office today which I personally interpreted demonstrated atrial flutter with a left bundle-branch block.   Review of Systems: As per "subjective", otherwise negative.  No Known Allergies  Current Outpatient Prescriptions  Medication Sig Dispense Refill  . benazepril (LOTENSIN) 10 MG tablet Take 1 tablet by mouth daily.  0  . beta carotene w/minerals (OCUVITE) tablet Take 1 tablet by mouth 2 (two) times daily.     Marland Kitchen ELIQUIS 2.5 MG TABS tablet Take 1 tablet by mouth 2 (two) times daily.    . Fluticasone-Salmeterol (ADVAIR) 250-50 MCG/DOSE AEPB Inhale 1 puff into the lungs 2 (two) times daily.    . furosemide (LASIX) 40 MG tablet Take 1 tablet (40 mg total) by mouth daily. 90 tablet 3  . gabapentin (NEURONTIN) 100 MG capsule Take 200 mg by mouth 3 (three) times daily.     . metoprolol (LOPRESSOR) 50 MG tablet Take 1.5 tablets (75 mg total) by mouth 2 (two) times daily. 270 tablet 3  . potassium chloride SA (K-DUR,KLOR-CON) 20 MEQ tablet Take 20 mEq by mouth 2 (two) times daily.     No current facility-administered medications for this visit.    Past Medical History  Diagnosis Date  . Hypertension   . Fluttering heart ([place])   . Pancreatitis, acute   . Cancer ([place])     Lt Breast, Skin    Past Surgical History  Procedure Laterality Date  . Cholecystectomy    . Breast surgery    . Tonsillectomy      Social History   Social History  . Marital Status: Single    Spouse Name: N/A  . Number of Children: N/A  . Years of Education: N/A   Occupational History  . Not on file.   Social History Main Topics  . Smoking status: Former Smoker -- 0.50  packs/day for 40 years    Types: Cigarettes    Start date: 09/03/1942    Quit date: 09/03/1982  . Smokeless tobacco: Never Used  . Alcohol Use: 5.4 oz/week    9 Glasses of wine per week     Comment: wine  . Drug Use: No  . Sexual Activity: Not on file   Other Topics Concern  . Not on file   Social History Narrative     Filed Vitals:   04/29/16 1044  BP: 104/52  Pulse: 81  Height: 5\' 6"  (1.676 m)  Weight: 125 lb (56.7 kg)  SpO2: 97%    PHYSICAL EXAM General: NAD HEENT: Normal. Neck: No JVD, no thyromegaly. Lungs: Clear to auscultation bilaterally with normal respiratory effort. CV: Nondisplaced PMI.  Regular rate and rhythm, normal S1/S2, no S3/S4, no murmur. No pretibial or periankle edema.    Abdomen: Soft, nontender, no distention.  Neurologic: Alert.  Psych: Normal affect. Skin: Normal. Musculoskeletal: No gross deformities.    ECG: Most recent ECG reviewed.      ASSESSMENT AND PLAN: 1. Chronic systolic heart failure: Stable. No ischemia by stress testing in 08/2015. LVEF remains severely reduced at 30% with no marked changes since 2014. Has restrictive diastolic physiology. No leg swelling or pulmonary edema. Continue metoprolol and Lasix at current doses.  2.  Chronic atrial flutter: HR controlled on metoprolol 75 mg bid. Anticoagulated with Eliquis.   3. Essential HTN: Controlled. No dizziness. No changes.  4. LBBB: Chronic.  Dispo: f/u 6 months.   Kate Sable, M.D., F.A.C.C.

## 2016-04-29 NOTE — Patient Instructions (Signed)
Your physician wants you to follow-up in: 6 months Dr Koneswaran You will receive a reminder letter in the mail two months in advance. If you don't receive a letter, please call our office to schedule the follow-up appointment.    Your physician recommends that you continue on your current medications as directed. Please refer to the Current Medication list given to you today.     If you need a refill on your cardiac medications before your next appointment, please call your pharmacy.     Thank you for choosing  [facility] !         

## 2016-05-05 ENCOUNTER — Encounter: Payer: Self-pay | Admitting: Cardiovascular Disease

## 2016-06-11 ENCOUNTER — Other Ambulatory Visit (HOSPITAL_COMMUNITY): Payer: Self-pay | Admitting: Internal Medicine

## 2016-06-11 DIAGNOSIS — R911 Solitary pulmonary nodule: Secondary | ICD-10-CM

## 2016-06-26 ENCOUNTER — Ambulatory Visit (HOSPITAL_COMMUNITY)
Admission: RE | Admit: 2016-06-26 | Discharge: 2016-06-26 | Disposition: A | Discharge status: Home / Self Care | Payer: Medicare FFS | Source: Ambulatory Visit | Attending: Internal Medicine | Admitting: Internal Medicine

## 2016-06-26 DIAGNOSIS — I700 Atherosclerosis of aorta: Secondary | ICD-10-CM | POA: Insufficient documentation

## 2016-06-26 DIAGNOSIS — I517 Cardiomegaly: Secondary | ICD-10-CM | POA: Insufficient documentation

## 2016-06-26 DIAGNOSIS — R911 Solitary pulmonary nodule: Secondary | ICD-10-CM

## 2016-06-26 DIAGNOSIS — I2510 Atherosclerotic heart disease of native coronary artery without angina pectoris: Secondary | ICD-10-CM | POA: Insufficient documentation

## 2016-06-26 DIAGNOSIS — N281 Cyst of kidney, acquired: Secondary | ICD-10-CM | POA: Insufficient documentation

## 2016-06-26 DIAGNOSIS — R918 Other nonspecific abnormal finding of lung field: Secondary | ICD-10-CM | POA: Diagnosis not present

## 2016-07-04 IMAGING — CT CT CHEST W/O CM
2 of 3 series · 15 of 36 positions shown, 18 images · non-contrast
Comparison: CT of the abdomen and pelvis 05/04/2015. Chest CT
09/20/2012.

CLINICAL DATA: Followup pulmonary nodule seen on prior CT

EXAM:
CT CHEST WITHOUT CONTRAST
TECHNIQUE: Multidetector CT imaging of the chest was performed following the
standard protocol without IV contrast.

[Series 2: chestroutine 5.0 b40f · axial · 0.61mm/px · z∈[-306,-26]mm · 12 of 66 slices shown, 15 images]
[im 5/66  mediastinal]
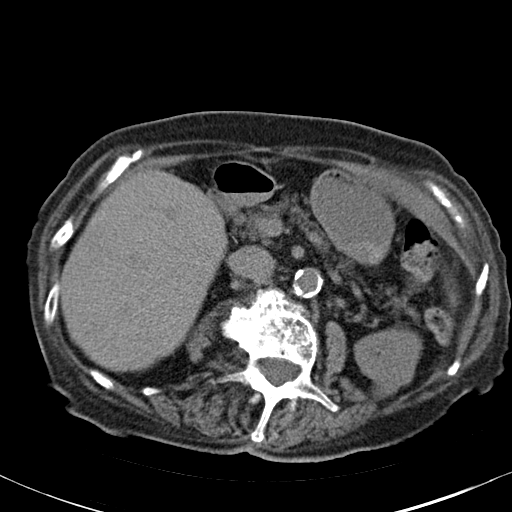
[im 5/66  lung]
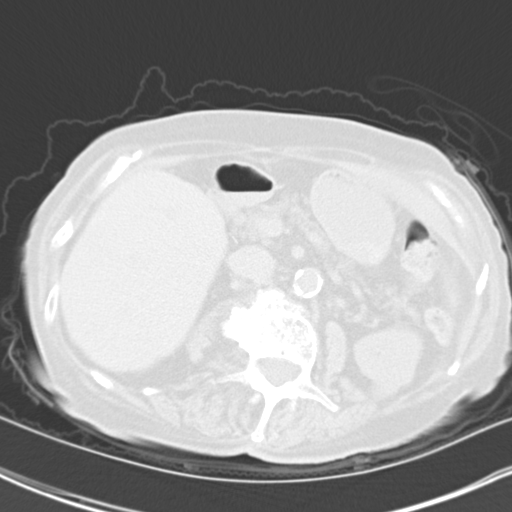
[im 10/66  lung]
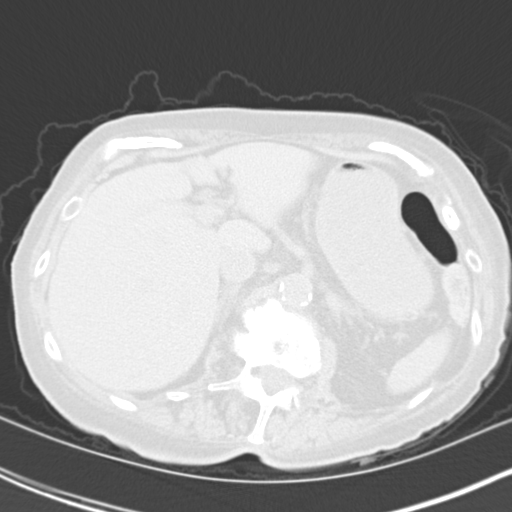
[im 15/66  lung]
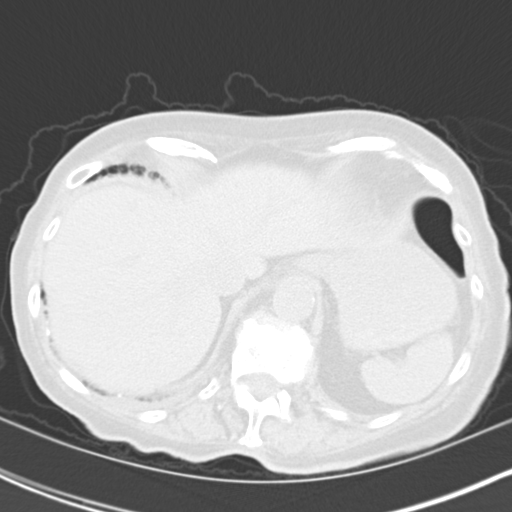
[im 20/66  lung]
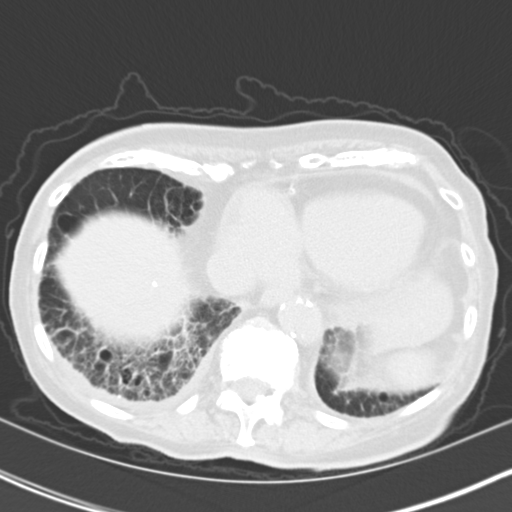
[im 25/66  mediastinal]
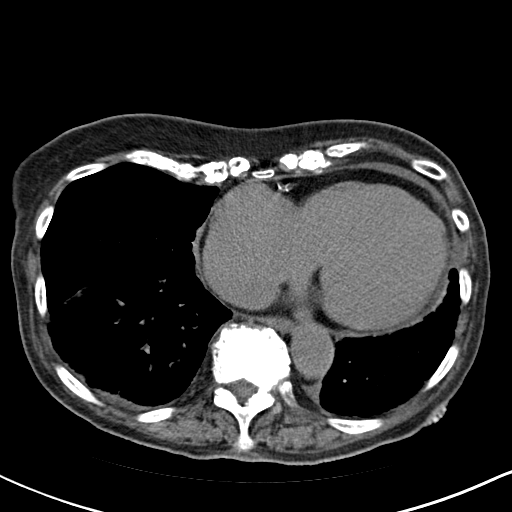
[im 25/66  lung]
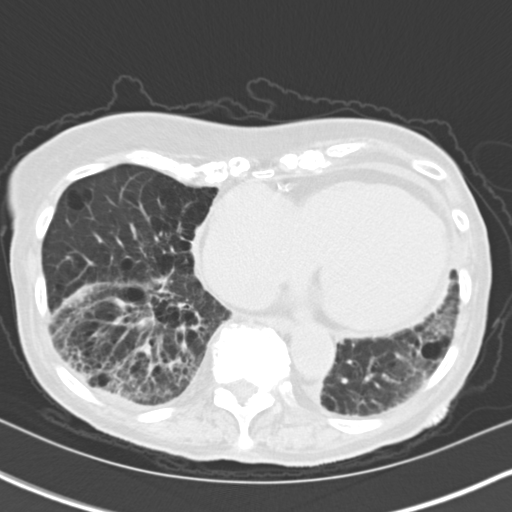
[im 29/66  lung]
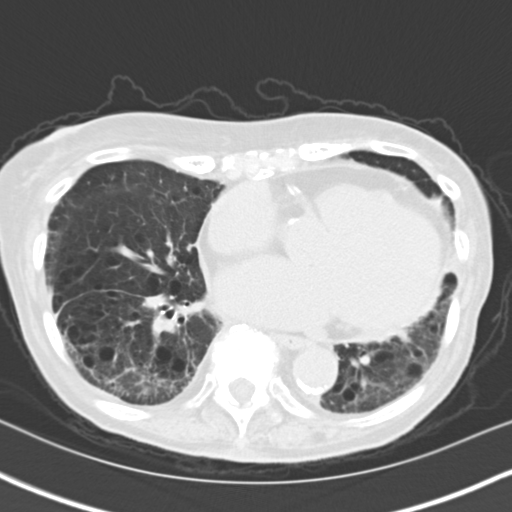
[im 37/66  lung]
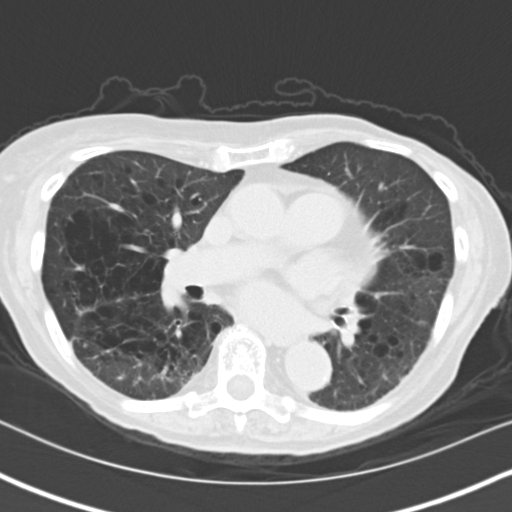
[im 41/66  lung]
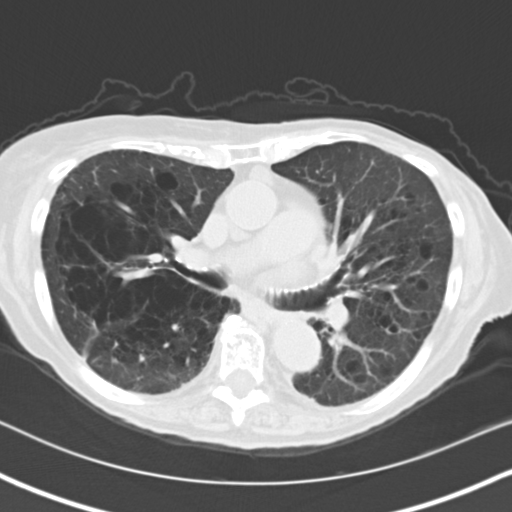
[im 46/66  mediastinal]
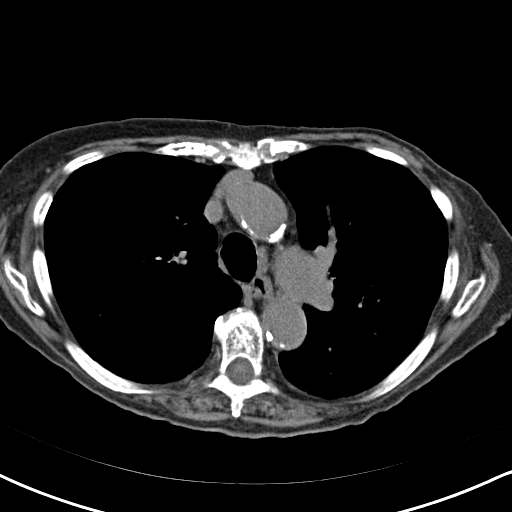
[im 46/66  lung]
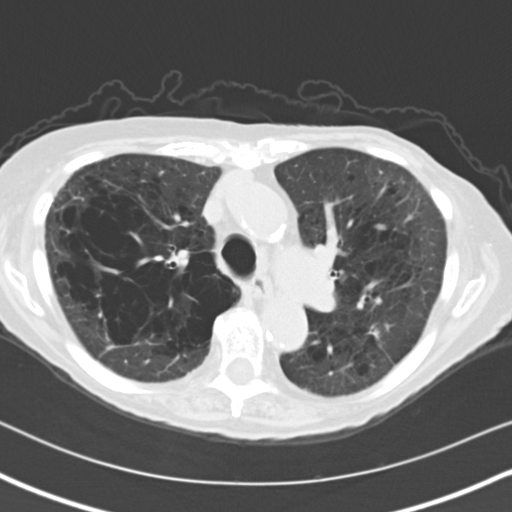
[im 51/66  lung]
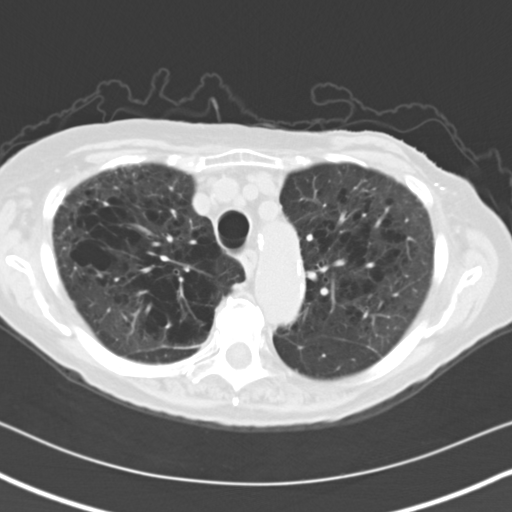
[im 56/66  lung]
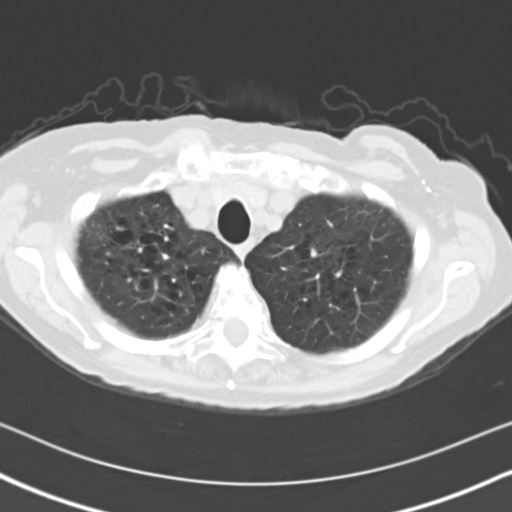
[im 61/66  lung]
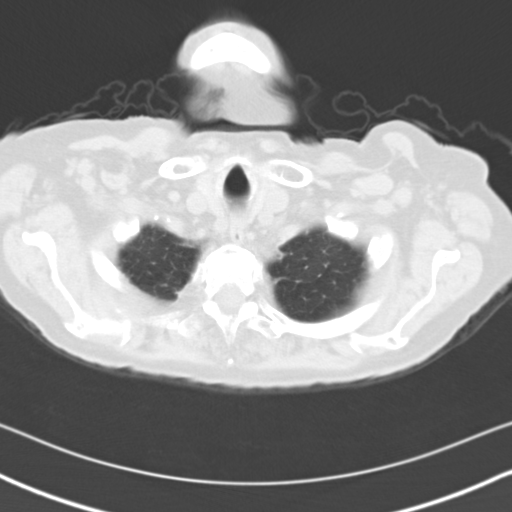

[Series 4: mpr coro 3mm · coronal · 0.64mm/px · 3 of 71 slices shown]
[im 15/71  lung]
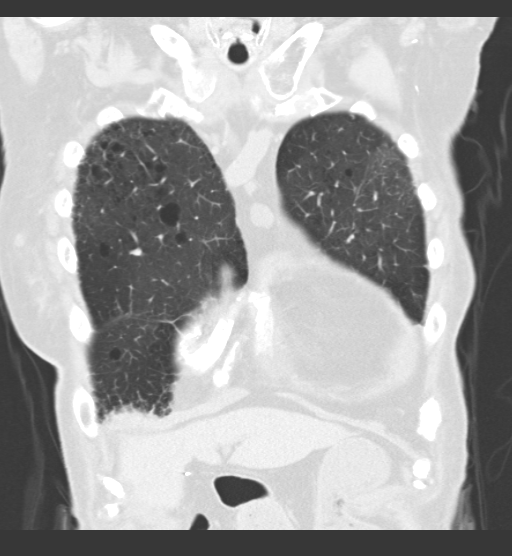
[im 29/71  lung]
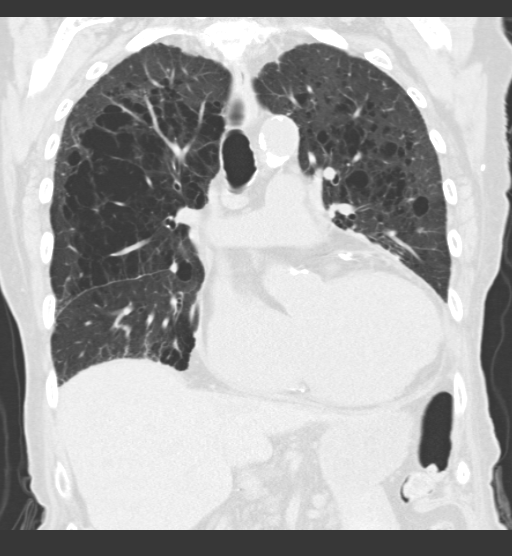
[im 43/71  lung]
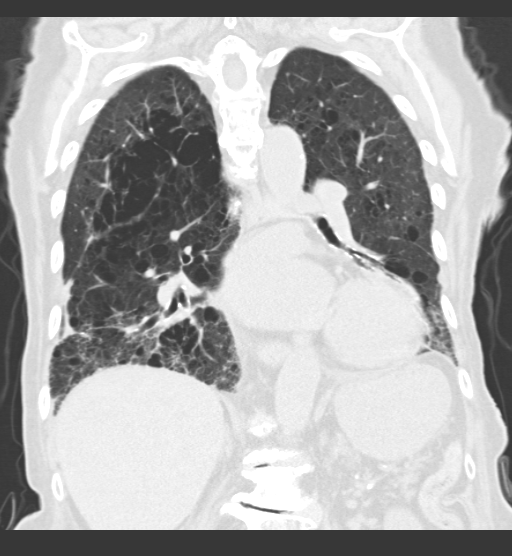

[15 of 36 positions shown; findings below may reference images not displayed]

FINDINGS: Severe emphysematous changes in the lungs. Peripheral platelike
subpleural nodule in the right middle lobe measures 7 mm on image
45, stable. Smaller subpleural nodule on image 41 is also stable.
Subpleural nodule in the right lower lobe posteriorly on image 39
measures 9 mm and is stable.

Spiculated mixed solid and ground-glass nodule in the left upper
lobe along the fissure major [DATE] mm on image 20. Small subpleural
nodule in the posterior left lower lobe on image 36 measures 5 mm
and is stable.

Trace right pleural effusion. Scarring in the lung bases. There is
cardiomegaly. Coronary artery and aortic calcifications noted. The
aorta is tortuous, non aneurysmal. There are borderline sized
mediastinal lymph nodes. Anterior mediastinal lymph node on image 26
has a short axis diameter of 8 mm. Precarinal lymph node on image 24
has a short axis diameter of 9 mm. Smaller scattered mediastinal
lymph nodes elsewhere. No axillary or hilar adenopathy.

Imaging into the upper abdomen shows no acute findings.

No acute bony abnormality or focal bone lesion.
IMPRESSION: Numerous subpleural nodules in the lower lung fields bilaterally are
stable since prior abdomen and pelvic CT. There is a spiculated
mixed solid and ground-glass nodule in the posterior left upper lobe
which was not imaged on prior study and is new since 5363. This
warrants additional follow-up with repeat CT in 6 months to assess
for change. This nodule is borderline size to assess with PET CT.

Severe emphysema.  Chronic scarring in the lung bases.

Cardiomegaly, coronary artery and aortic atherosclerosis.

Small and borderline sized scattered mediastinal lymph nodes.

## 2016-07-20 ENCOUNTER — Other Ambulatory Visit: Payer: Self-pay | Admitting: Cardiovascular Disease

## 2016-10-28 ENCOUNTER — Ambulatory Visit: Payer: Medicare FFS | Admitting: Cardiovascular Disease

## 2016-11-02 ENCOUNTER — Other Ambulatory Visit: Payer: Self-pay | Admitting: Cardiovascular Disease

## 2016-11-10 ENCOUNTER — Ambulatory Visit: Payer: Medicare FFS | Admitting: Cardiovascular Disease

## 2016-12-11 ENCOUNTER — Ambulatory Visit (INDEPENDENT_AMBULATORY_CARE_PROVIDER_SITE_OTHER): Payer: Medicare FFS | Admitting: Cardiovascular Disease

## 2016-12-11 ENCOUNTER — Encounter: Payer: Self-pay | Admitting: Cardiovascular Disease

## 2016-12-11 VITALS — BP 114/68 | HR 71 | Ht 63.0 in | Wt 125.0 lb

## 2016-12-11 DIAGNOSIS — I5022 Chronic systolic (congestive) heart failure: Secondary | ICD-10-CM

## 2016-12-11 DIAGNOSIS — I429 Cardiomyopathy, unspecified: Secondary | ICD-10-CM

## 2016-12-11 DIAGNOSIS — I10 Essential (primary) hypertension: Secondary | ICD-10-CM | POA: Diagnosis not present

## 2016-12-11 DIAGNOSIS — I4892 Unspecified atrial flutter: Secondary | ICD-10-CM

## 2016-12-11 DIAGNOSIS — I447 Left bundle-branch block, unspecified: Secondary | ICD-10-CM

## 2016-12-11 NOTE — Patient Instructions (Signed)

## 2016-12-11 NOTE — Progress Notes (Signed)
SUBJECTIVE: The patient presents for routine follow up for chronic systolic heart failure and atrial flutter. She is 83 years old. She has chronic exertional dyspnea which is stable. She denies leg swelling, orthopnea, PND, and syncope.  She has had one or two episodes of dizziness. She had palpitations once. She denies chest pain. She denies bleeding problems with apixaban. She has some generalized weakness after walking 50 yards.   Review of Systems: As per "subjective", otherwise negative.  No Known Allergies  Current Outpatient Prescriptions  Medication Sig Dispense Refill  . beta carotene w/minerals (OCUVITE) tablet Take 1 tablet by mouth 2 (two) times daily.     Marland Kitchen ELIQUIS 2.5 MG TABS tablet Take 1 tablet by mouth 2 (two) times daily.    . Fluticasone-Salmeterol (ADVAIR) 250-50 MCG/DOSE AEPB Inhale 1 puff into the lungs 2 (two) times daily.    . furosemide (LASIX) 40 MG tablet TAKE 1 TABLET EVERY DAY 90 tablet 1  . gabapentin (NEURONTIN) 100 MG capsule Take 200 mg by mouth 3 (three) times daily.     Marland Kitchen losartan (COZAAR) 50 MG tablet Take 50 mg by mouth daily.     . metoprolol (LOPRESSOR) 50 MG tablet TAKE 1 AND 1/2 TABLETS TWICE DAILY 270 tablet 3  . potassium chloride SA (K-DUR,KLOR-CON) 20 MEQ tablet Take 20 mEq by mouth 2 (two) times daily.     No current facility-administered medications for this visit.     Past Medical History:  Diagnosis Date  . Cancer ([facility])    Lt Breast, Skin  . Fluttering heart   . Hypertension   . Pancreatitis, acute     Past Surgical History:  Procedure Laterality Date  . BREAST SURGERY    . CHOLECYSTECTOMY    . TONSILLECTOMY      Social History   Social History  . Marital status: Single    Spouse name: N/A  . Number of children: N/A  . Years of education: N/A   Occupational History  . Not on file.   Social History Main Topics  . Smoking status: Former Smoker    Packs/day: 0.50    Years: 40.00    Types: Cigarettes   Start date: 09/03/1942    Quit date: 09/03/1982  . Smokeless tobacco: Never Used  . Alcohol use 5.4 oz/week    9 Glasses of wine per week     Comment: wine  . Drug use: No  . Sexual activity: Not on file   Other Topics Concern  . Not on file   Social History Narrative  . No narrative on file     Vitals:   12/11/16 1101  BP: 114/68  Pulse: 71  SpO2: (!) 89%  Weight: 125 lb (56.7 kg)  Height: 5\' 3"  (1.6 m)    PHYSICAL EXAM General: NAD HEENT: Normal. Neck: No JVD, no thyromegaly. Lungs: Clear to auscultation bilaterally with normal respiratory effort. CV: Nondisplaced PMI.  Regular rate and rhythm, normal S1/S2, no S3/S4, no murmur. No pretibial or periankle edema.  Abdomen: Soft, nontender, no distention.  Neurologic: Alert and oriented.  Psych: Normal affect. Skin: Normal. Musculoskeletal: No gross deformities.    ECG: Most recent ECG reviewed.      ASSESSMENT AND PLAN:  1. Chronic systolic heart failure: Stable. No ischemia by stress testing in 08/2015. LVEF remains severely reduced at 30% with no marked changes since 2014. Has restrictive diastolic physiology. No leg swelling or pulmonary edema. Continue metoprolol and Lasix at current  doses.  2. Chronic atrial flutter: HR controlled on metoprolol 75 mg bid. Anticoagulated with Eliquis.   3. Essential HTN: Controlled. No dizziness. No changes.  4. LBBB: Chronic.  Dispo: f/u 6 months.  Kate Sable, M.D., F.A.C.C.

## 2017-01-06 ENCOUNTER — Other Ambulatory Visit: Payer: Self-pay | Admitting: Cardiovascular Disease

## 2017-02-03 ENCOUNTER — Other Ambulatory Visit (HOSPITAL_COMMUNITY): Payer: Self-pay | Admitting: Internal Medicine

## 2017-02-03 DIAGNOSIS — R918 Other nonspecific abnormal finding of lung field: Secondary | ICD-10-CM

## 2017-02-19 ENCOUNTER — Ambulatory Visit (HOSPITAL_COMMUNITY)
Admission: RE | Admit: 2017-02-19 | Discharge: 2017-02-19 | Disposition: A | Discharge status: Home / Self Care | Payer: Medicare FFS | Source: Ambulatory Visit | Attending: Internal Medicine | Admitting: Internal Medicine

## 2017-02-19 DIAGNOSIS — R918 Other nonspecific abnormal finding of lung field: Secondary | ICD-10-CM | POA: Diagnosis present

## 2017-06-16 ENCOUNTER — Ambulatory Visit (INDEPENDENT_AMBULATORY_CARE_PROVIDER_SITE_OTHER): Payer: Medicare FFS | Admitting: Cardiovascular Disease

## 2017-06-16 ENCOUNTER — Encounter: Payer: Self-pay | Admitting: Cardiovascular Disease

## 2017-06-16 VITALS — BP 116/70 | HR 85 | Ht 63.0 in | Wt 131.0 lb

## 2017-06-16 DIAGNOSIS — I10 Essential (primary) hypertension: Secondary | ICD-10-CM

## 2017-06-16 DIAGNOSIS — R0602 Shortness of breath: Secondary | ICD-10-CM

## 2017-06-16 DIAGNOSIS — I4892 Unspecified atrial flutter: Secondary | ICD-10-CM | POA: Diagnosis not present

## 2017-06-16 DIAGNOSIS — I5023 Acute on chronic systolic (congestive) heart failure: Secondary | ICD-10-CM | POA: Diagnosis not present

## 2017-06-16 DIAGNOSIS — Z79899 Other long term (current) drug therapy: Secondary | ICD-10-CM | POA: Diagnosis not present

## 2017-06-16 DIAGNOSIS — I447 Left bundle-branch block, unspecified: Secondary | ICD-10-CM | POA: Diagnosis not present

## 2017-06-16 DIAGNOSIS — I429 Cardiomyopathy, unspecified: Secondary | ICD-10-CM | POA: Diagnosis not present

## 2017-06-16 NOTE — Progress Notes (Signed)
SUBJECTIVE: The patient presents for routine follow up for chronic systolic heart failure and atrial flutter. She is 83 years old.  She has chronic exertional dyspnea which is stable which has gotten slightly worse over the past few days. She has also noted a weight gain in the past few days from 126 to 129 pounds (wt up 6 lbs by our scales since 12/11/16). She denies leg swelling, orthopnea, PND, chest pain, and syncope. She thinks her abdomen may be more distended.  ECG performed in the office today demonstrates atrial flutter with variable conduction with a left bundle branch block, 61 bpm.     Review of Systems: As per "subjective", otherwise negative.  No Known Allergies  Current Outpatient Prescriptions  Medication Sig Dispense Refill  . beta carotene w/minerals (OCUVITE) tablet Take 1 tablet by mouth 2 (two) times daily.     Marland Kitchen ELIQUIS 2.5 MG TABS tablet Take 1 tablet by mouth 2 (two) times daily.    . furosemide (LASIX) 40 MG tablet TAKE 1 TABLET EVERY DAY 90 tablet 1  . gabapentin (NEURONTIN) 100 MG capsule Take 200 mg by mouth 3 (three) times daily.     Marland Kitchen losartan (COZAAR) 50 MG tablet Take 50 mg by mouth daily.     . metoprolol (LOPRESSOR) 50 MG tablet TAKE 1 AND 1/2 TABLETS TWICE DAILY 270 tablet 3  . potassium chloride SA (K-DUR,KLOR-CON) 20 MEQ tablet Take 20 mEq by mouth 2 (two) times daily.     No current facility-administered medications for this visit.     Past Medical History:  Diagnosis Date  . Cancer ([facility])    Lt Breast, Skin  . Fluttering heart   . Hypertension   . Pancreatitis, acute     Past Surgical History:  Procedure Laterality Date  . BREAST SURGERY    . CHOLECYSTECTOMY    . TONSILLECTOMY      Social History   Social History  . Marital status: Single    Spouse name: N/A  . Number of children: N/A  . Years of education: N/A   Occupational History  . Not on file.   Social History Main Topics  . Smoking status: Former Smoker   Packs/day: 0.50    Years: 40.00    Types: Cigarettes    Start date: 09/03/1942    Quit date: 09/03/1982  . Smokeless tobacco: Never Used  . Alcohol use 5.4 oz/week    9 Glasses of wine per week     Comment: wine  . Drug use: No  . Sexual activity: Not on file   Other Topics Concern  . Not on file   Social History Narrative  . No narrative on file     Vitals:   06/16/17 1103  BP: 116/70  Pulse: 85  SpO2: 91%  Weight: 131 lb (59.4 kg)  Height: 5\' 3"  (1.6 m)    Wt Readings from Last 3 Encounters:  06/16/17 131 lb (59.4 kg)  12/11/16 125 lb (56.7 kg)  04/29/16 125 lb (56.7 kg)     PHYSICAL EXAM General: NAD HEENT: Normal. Neck: No JVD, no thyromegaly. Lungs: Clear to auscultation bilaterally with normal respiratory effort. CV: Nondisplaced PMI.  Regular rate and irregular rhythm, normal S1/S2, no S3, no murmur. No pretibial or periankle edema.   Abdomen: Soft, nontender, no distention.  Neurologic: Alert and oriented.  Psych: Normal affect. Skin: Normal. Musculoskeletal: No gross deformities.    ECG: Most recent ECG reviewed.   Labs:  Lab Results  Component Value Date/Time   K 4.3 05/05/2015 06:17 AM   BUN 15 05/05/2015 06:17 AM   CREATININE 0.91 05/05/2015 06:17 AM   ALT 16 05/04/2015 10:20 AM   HGB 13.6 05/05/2015 06:17 AM     Lipids: No results found for: LDLCALC, LDLDIRECT, CHOL, TRIG, HDL     ASSESSMENT AND PLAN:  1. Acute on chronic systolic heart failure: While there is no leg edema or abnormal pulmonary exam, she has become more short of breath with a 3 pound weight gain in the past few days  (wt up 6 lbs by our scales since 12/11/16).  I will increase Lasix to 40 mg twice daily for 3 days. I will check a basic metabolic panel in 2 days. She is on supplemental potassium. No ischemia by stress testing in 08/2015. LVEF remains severely reduced at 30% with no marked changes since 2014. Has restrictive diastolic physiology. Continue metoprolol at  current dose.  2. Chronic atrial flutter: HR controlled on metoprolol 75 mg bid. Anticoagulated with Eliquis.   3. Essential HTN: Controlled. No dizziness. Monitor given diuretic adjustments.  4. LBBB: Chronic.     Disposition: Follow up 1 week.   Kate Sable, M.D., F.A.C.C.

## 2017-06-16 NOTE — Patient Instructions (Signed)
Medication Instructions:  INCREASE LASIX TO 40 MG - TWO TIMES DAILY FOR 3 DAYS, THEN GO BACK TO 40 MG DAILY  Labwork:FRIDAY  BMET   Testing/Procedures: NONE  Follow-Up: Your physician recommends that you schedule a follow-up appointment in: 1 WEEK    Any Other Special Instructions Will Be Listed Below (If Applicable).     If you need a refill on your cardiac medications before your next appointment, please call your pharmacy.

## 2017-06-28 NOTE — Progress Notes (Signed)
Cardiology Office Note   Date:  06/29/2017   ID:  [name], DOB 1917/03/14, MRN [id number]  PCP:  Asencion Noble, MD  Cardiologist:   Bronson Ing  Chief Complaint  Patient presents with  . Congestive Heart Failure  . Atrial Flutter      History of Present Illness: [name] is a 83 y.o. female who presents for ongoing assessment and management of chronic systolic CHF and atrial flutter. She was last seen in the by Dr. Bronson Ing on 06/16/2017, and lasix was increased to 40 mg BID for 3 days due to weight gain. Follow up labs were ordered.   She comes today with a head cold. She denies chest pain or dyspnea on exertion. She uses home O2 via [place] at home but now uses this while out of the home as well. Felt well with extra doses of lasix. Did not get labs.   Past Medical History:  Diagnosis Date  . Cancer (HCC)    Lt Breast, Skin  . Fluttering heart   . Hypertension   . Pancreatitis, acute     Past Surgical History:  Procedure Laterality Date  . BREAST SURGERY    . CHOLECYSTECTOMY    . TONSILLECTOMY       Current Outpatient Prescriptions  Medication Sig Dispense Refill  . beta carotene w/minerals (OCUVITE) tablet Take 1 tablet by mouth 2 (two) times daily.     Marland Kitchen ELIQUIS 2.5 MG TABS tablet Take 1 tablet by mouth 2 (two) times daily.    . furosemide (LASIX) 40 MG tablet TAKE 1 TABLET EVERY DAY 90 tablet 1  . gabapentin (NEURONTIN) 100 MG capsule Take 200 mg by mouth 3 (three) times daily.     Marland Kitchen losartan (COZAAR) 50 MG tablet Take 50 mg by mouth daily.     . metoprolol (LOPRESSOR) 50 MG tablet TAKE 1 AND 1/2 TABLETS TWICE DAILY 270 tablet 3  . potassium chloride SA (K-DUR,KLOR-CON) 20 MEQ tablet Take 20 mEq by mouth daily.      No current facility-administered medications for this visit.     Allergies:   Patient has no known allergies.    Social History:  The patient  reports that she quit smoking about 34 years ago. Her smoking use included Cigarettes. She  started smoking about 74 years ago. She has a 20.00 pack-year smoking history. She has never used smokeless tobacco. She reports that she drinks about 5.4 oz of alcohol per week . She reports that she does not use drugs.   Family History:  The patient's family history includes Cancer in her paternal aunt; Heart attack in her paternal grandmother; Heart disease in her paternal grandmother; Heart failure in her paternal grandmother; Hypertension in her mother.    ROS: All other systems are reviewed and negative. Unless otherwise mentioned in H&P    PHYSICAL EXAM: VS:  BP 108/64 (BP Location: Right Arm)   Pulse 62   Ht 5\' 7"  (1.702 m)   Wt 130 lb (59 kg)   SpO2 90%   BMI 20.36 kg/m  , BMI Body mass index is 20.36 kg/m. GEN: Well nourished, well developed, in no acute distress  HEENT: Clear nasal drainage and frequently blowing her nose.  Neck: no JVD, carotid bruits, or masses Cardiac: IRRR; no murmurs, rubs, or gallops,no edema  Respiratory:  clear to auscultation bilaterally, normal work of breathing GI: soft, nontender, nondistended, + BS MS: no deformity or atrophy  Skin: warm and dry, no rash Neuro:  Strength and sensation are intact Psych: euthymic mood, full affect   Recent Labs: No results found for requested labs within last 8760 hours.    Lipid Panel No results found for: CHOL, TRIG, HDL, CHOLHDL, VLDL, LDLCALC, LDLDIRECT    Wt Readings from Last 3 Encounters:  06/29/17 130 lb (59 kg)  06/16/17 131 lb (59.4 kg)  12/11/16 125 lb (56.7 kg)      Other studies Reviewed: Echocardiogram 04-Sep-2015 ------------------------------------------------------------------- LV EF: 25% -   30%  ------------------------------------------------------------------- Indications:      Cardiomyopathy - unspecified 425.9.  ------------------------------------------------------------------- History:   PMH:   Atrial  flutter.  ------------------------------------------------------------------- Study Conclusions  - Left ventricle: The cavity size was normal. Wall thickness was   increased in a pattern of mild LVH. Systolic function was   severely reduced. The estimated ejection fraction was   approximately 30%. Doppler parameters are consistent with   restrictive physiology, indicative of decreased left ventricular   diastolic compliance and/or increased left atrial pressure.   Doppler parameters are consistent with high ventricular filling   pressure. - Ventricular septum: Septal motion showed abnormal function and   dyssynergy. These changes are consistent with a left bundle   branch block. - Aortic valve: Mildly calcified annulus. Trileaflet; mildly   thickened leaflets. - Mitral valve: Mildly thickened leaflets . There was mild   regurgitation. - Left atrium: The atrium was moderately dilated. - Right ventricle: Systolic function was mildly reduced. - Right atrium: The atrium was moderately dilated. - Atrial septum: The septum bowed from left to right, consistent   with increased left atrial pressure. - Tricuspid valve: There was mild regurgitation. - Pulmonic valve: There was mild regurgitation. - Pulmonary arteries: Systolic pressure was mild to moderately   increased. PA peak pressure: 44 mm Hg (S). - Systemic veins: Dilated IVC with normal respiratory variation.   Estimated CVP 8 mmHg. - Pericardium, extracardiac: A trivial pericardial effusion was   identified.  ASSESSMENT AND PLAN:  1. Chronic Systolic CHF: She remains on lasix and has done well with temporary increase in dose for 3 days. No evidence of volume overload. Briggs get BMET. Continue BP control.   2. Atrial flutter: Heart rate is controlled. No complaints of bleeding.   3. O2 Dependence: She has a head cold today. I have recommended that she change her O2 tubing once she recovers to prevent ongoing possible  bacteria/virus growth.      Current medicines are reviewed at length with the patient today.    Labs/ tests ordered today include: BMET Phill Myron. West Pugh, ANP, AACC   06/29/2017 1:46 PM    [facility] [address] [place], [place] [address] Phone: [phone]; Fax: [phone]

## 2017-06-29 ENCOUNTER — Ambulatory Visit (INDEPENDENT_AMBULATORY_CARE_PROVIDER_SITE_OTHER): Payer: Medicare FFS | Admitting: Adult Health

## 2017-06-29 ENCOUNTER — Other Ambulatory Visit (HOSPITAL_COMMUNITY)
Admission: RE | Admit: 2017-06-29 | Discharge: 2017-06-29 | Disposition: A | Discharge status: Home / Self Care | Payer: Medicare FFS | Source: Ambulatory Visit | Attending: Adult Health | Admitting: Adult Health

## 2017-06-29 ENCOUNTER — Encounter: Payer: Self-pay | Admitting: Adult Health

## 2017-06-29 VITALS — BP 108/64 | HR 62 | Ht 67.0 in | Wt 130.0 lb

## 2017-06-29 DIAGNOSIS — I5022 Chronic systolic (congestive) heart failure: Secondary | ICD-10-CM | POA: Diagnosis not present

## 2017-06-29 DIAGNOSIS — Z79899 Other long term (current) drug therapy: Secondary | ICD-10-CM | POA: Diagnosis not present

## 2017-06-29 DIAGNOSIS — I4892 Unspecified atrial flutter: Secondary | ICD-10-CM

## 2017-06-29 LAB — BASIC METABOLIC PANEL
ANION GAP: 7 (ref 5–15)
BUN: 13 mg/dL (ref 6–20)
CHLORIDE: 97 mmol/L — AB (ref 101–111)
CO2: 32 mmol/L (ref 22–32)
Calcium: 8.8 mg/dL — ABNORMAL LOW (ref 8.9–10.3)
Creatinine, Ser: 0.69 mg/dL (ref 0.44–1.00)
GFR calc Af Amer: >60 mL/min (ref >60)
GLUCOSE: 97 mg/dL (ref 65–99)
Potassium: 4.2 mmol/L (ref 3.5–5.1)
Sodium: 136 mmol/L (ref 135–145)

## 2017-06-29 NOTE — Patient Instructions (Signed)
Medication Instructions:  Your physician recommends that you continue on your current medications as directed. Please refer to the Current Medication list given to you today.   Labwork: BMET  Testing/Procedures: NONE  Follow-Up: Your physician wants you to follow-up in: 4 MONTHS.  You will receive a reminder letter in the mail two months in advance. If you don't receive a letter, please call our office to schedule the follow-up appointment.   Any Other Special Instructions Will Be Listed Below (If Applicable).     If you need a refill on your cardiac medications before your next appointment, please call your pharmacy.

## 2017-06-30 ENCOUNTER — Telehealth: Payer: Self-pay | Admitting: *Deleted

## 2017-06-30 NOTE — Telephone Encounter (Signed)
Called patient with test results. No answer. Left message to call back.  

## 2017-06-30 NOTE — Telephone Encounter (Signed)
-----   Message from Lendon Colonel, NP sent at 06/29/2017  4:35 PM EDT ----- Labs are reviewed. No changes in regimen at this time.

## 2017-07-01 ENCOUNTER — Telehealth: Payer: Self-pay | Admitting: Adult Health

## 2017-07-01 NOTE — Telephone Encounter (Signed)
Spoke with pt. Test results given.

## 2017-07-01 NOTE — Telephone Encounter (Signed)
Per Thayer Headings --pt's oxygen is [employer].

## 2017-10-18 ENCOUNTER — Other Ambulatory Visit: Payer: Self-pay | Admitting: Cardiovascular Disease

## 2017-10-29 ENCOUNTER — Ambulatory Visit (HOSPITAL_COMMUNITY)
Admission: RE | Admit: 2017-10-29 | Discharge: 2017-10-29 | Disposition: A | Discharge status: Home / Self Care | Payer: Medicare FFS | Source: Ambulatory Visit | Attending: Internal Medicine | Admitting: Internal Medicine

## 2017-10-29 ENCOUNTER — Other Ambulatory Visit (HOSPITAL_COMMUNITY): Payer: Self-pay | Admitting: Internal Medicine

## 2017-10-29 DIAGNOSIS — R05 Cough: Secondary | ICD-10-CM | POA: Diagnosis present

## 2017-10-29 DIAGNOSIS — R059 Cough, unspecified: Secondary | ICD-10-CM

## 2017-10-29 DIAGNOSIS — J8410 Pulmonary fibrosis, unspecified: Secondary | ICD-10-CM | POA: Diagnosis not present

## 2017-12-07 ENCOUNTER — Encounter: Payer: Self-pay | Admitting: Cardiovascular Disease

## 2017-12-07 ENCOUNTER — Ambulatory Visit (HOSPITAL_COMMUNITY)
Admission: RE | Admit: 2017-12-07 | Discharge: 2017-12-07 | Disposition: A | Discharge status: Home / Self Care | Payer: Medicare FFS | Source: Ambulatory Visit | Attending: Cardiovascular Disease | Admitting: Cardiovascular Disease

## 2017-12-07 ENCOUNTER — Ambulatory Visit: Payer: Medicare FFS | Admitting: Cardiovascular Disease

## 2017-12-07 VITALS — BP 134/84 | HR 71 | Ht 64.0 in | Wt 125.0 lb

## 2017-12-07 DIAGNOSIS — I5022 Chronic systolic (congestive) heart failure: Secondary | ICD-10-CM | POA: Diagnosis not present

## 2017-12-07 DIAGNOSIS — R0602 Shortness of breath: Secondary | ICD-10-CM | POA: Insufficient documentation

## 2017-12-07 DIAGNOSIS — I447 Left bundle-branch block, unspecified: Secondary | ICD-10-CM

## 2017-12-07 DIAGNOSIS — I429 Cardiomyopathy, unspecified: Secondary | ICD-10-CM

## 2017-12-07 DIAGNOSIS — I517 Cardiomegaly: Secondary | ICD-10-CM | POA: Insufficient documentation

## 2017-12-07 DIAGNOSIS — I10 Essential (primary) hypertension: Secondary | ICD-10-CM

## 2017-12-07 DIAGNOSIS — I4892 Unspecified atrial flutter: Secondary | ICD-10-CM | POA: Diagnosis not present

## 2017-12-07 NOTE — Progress Notes (Signed)
SUBJECTIVE: The patient presents for routine follow up for chronic systolic heart failureand atrial flutter. She is 84 years old.  She continues to live alone.  Her nephew is Dr. Asencion Noble.  She said she developed a cough and some shortness of breath about 2-3 weeks ago and also had a low-grade fever about 6 weeks ago.  She feels like overall her symptoms have improved but she did say today she has had more shortness of breath when walking around at home.  She occasionally has some minor lower sternal chest discomfort but says it is not painful.  She has had a diminished appetite.  She denies orthopnea and leg swelling.      Review of Systems: As per "subjective", otherwise negative.  No Known Allergies  Current Outpatient Medications  Medication Sig Dispense Refill  . beta carotene w/minerals (OCUVITE) tablet Take 1 tablet by mouth 2 (two) times daily.     Marland Kitchen ELIQUIS 2.5 MG TABS tablet Take 1 tablet by mouth 2 (two) times daily.    . furosemide (LASIX) 40 MG tablet TAKE 1 TABLET EVERY DAY 90 tablet 1  . gabapentin (NEURONTIN) 100 MG capsule Take 200 mg by mouth 3 (three) times daily.     Marland Kitchen losartan (COZAAR) 50 MG tablet Take 50 mg by mouth daily.     . metoprolol (LOPRESSOR) 50 MG tablet TAKE 1 AND 1/2 TABLETS TWICE DAILY 270 tablet 3  . potassium chloride SA (K-DUR,KLOR-CON) 20 MEQ tablet Take 20 mEq by mouth daily.      No current facility-administered medications for this visit.     Past Medical History:  Diagnosis Date  . Cancer (HCC)    Lt Breast, Skin  . Fluttering heart   . Hypertension   . Pancreatitis, acute     Past Surgical History:  Procedure Laterality Date  . BREAST SURGERY    . CHOLECYSTECTOMY    . TONSILLECTOMY      Social History   Socioeconomic History  . Marital status: Single    Spouse name: Not on file  . Number of children: Not on file  . Years of education: Not on file  . Highest education level: Not on file  Social Needs  .  Financial resource strain: Not on file  . Food insecurity - worry: Not on file  . Food insecurity - inability: Not on file  . Transportation needs - medical: Not on file  . Transportation needs - non-medical: Not on file  Occupational History  . Not on file  Tobacco Use  . Smoking status: Former Smoker    Packs/day: 0.50    Years: 40.00    Pack years: 20.00    Types: Cigarettes    Start date: 09/03/1942    Last attempt to quit: 09/03/1982    Years since quitting: 35.2  . Smokeless tobacco: Never Used  Substance and Sexual Activity  . Alcohol use: Yes    Alcohol/week: 5.4 oz    Types: 9 Glasses of wine per week    Comment: wine  . Drug use: No  . Sexual activity: Not on file  Other Topics Concern  . Not on file  Social History Narrative  . Not on file     Vitals:   12/07/17 1324  BP: 134/84  Pulse: 71  SpO2: 90%  Weight: 125 lb (56.7 kg)  Height: 5\' 4"  (1.626 m)    Wt Readings from Last 3 Encounters:  12/07/17 125 lb (56.7  kg)  06/29/17 130 lb (59 kg)  06/16/17 131 lb (59.4 kg)     PHYSICAL EXAM General: NAD HEENT: Normal. Neck: No JVD, no thyromegaly. Lungs: Faint intermittent scattered crackles and expiratory wheezes. CV: Regular rate and rhythm, normal S1/S2, no S3/S4, no murmur. No pretibial or periankle edema.  Abdomen: Soft, nontender, no distention.  Neurologic: Alert and oriented.  Psych: Normal affect. Skin: Normal. Musculoskeletal: No gross deformities.    ECG: Most recent ECG reviewed.   Labs: Lab Results  Component Value Date/Time   K 4.2 06/29/2017 01:58 PM   BUN 13 06/29/2017 01:58 PM   CREATININE 0.69 06/29/2017 01:58 PM   ALT 16 05/04/2015 10:20 AM   HGB 13.6 05/05/2015 06:17 AM     Lipids: No results found for: LDLCALC, LDLDIRECT, CHOL, TRIG, HDL     ASSESSMENT AND PLAN: 1.  Chronic systolic heart failure: Euvolemic and stable.  Weight down 5 pounds since last visit in September 2018.  Continue Lasix 40 mg daily.  She is  also on losartan and metoprolol tartrate. No ischemia by stress testing in 08/2015. LVEF remains severely reduced at 30% with no marked changes since 2014. Has restrictive diastolic physiology.  2.  Chronic atrial flutter: Heart rate is controlled on present therapy with metoprolol 75 mg twice daily.  Systemic anticoagulation is being achieved with low-dose Eliquis 2.5 mg twice daily.  No changes.  3.  Chronic hypertension: Blood pressure is controlled.  No change to therapy.  4.  Left bundle branch block: This is chronic.  5.  Shortness of breath: I will obtain a chest x-ray to make certain there is no underlying infiltrate.   Disposition: Follow up 3 months but I would see her earlier if chest x-ray shows significant abnormalities.   Kate Sable, M.D., F.A.C.C.

## 2017-12-07 NOTE — Patient Instructions (Signed)
Medication Instructions:  Your physician recommends that you continue on your current medications as directed. Please refer to the Current Medication list given to you today.   Labwork: NONE  Testing/Procedures: A chest x-ray takes a picture of the organs and structures inside the chest, including the heart, lungs, and blood vessels. This test can show several things, including, whether the heart is enlarges; whether fluid is building up in the lungs; and whether pacemaker / defibrillator leads are still in place.   Follow-Up: Your physician recommends that you schedule a follow-up appointment in: 3 MONTHS    Any Other Special Instructions Will Be Listed Below (If Applicable).     If you need a refill on your cardiac medications before your next appointment, please call your pharmacy.

## 2018-01-03 ENCOUNTER — Other Ambulatory Visit: Payer: Self-pay | Admitting: Cardiovascular Disease

## 2018-01-24 ENCOUNTER — Other Ambulatory Visit: Payer: Self-pay | Admitting: Cardiovascular Disease

## 2018-02-22 ENCOUNTER — Other Ambulatory Visit (HOSPITAL_COMMUNITY): Payer: Self-pay | Admitting: Internal Medicine

## 2018-02-22 DIAGNOSIS — R918 Other nonspecific abnormal finding of lung field: Secondary | ICD-10-CM

## 2018-03-08 ENCOUNTER — Ambulatory Visit: Payer: Medicare FFS | Admitting: Cardiovascular Disease

## 2018-03-11 ENCOUNTER — Ambulatory Visit: Payer: Medicare FFS | Admitting: Cardiovascular Disease

## 2018-03-11 ENCOUNTER — Ambulatory Visit (HOSPITAL_COMMUNITY): Payer: Medicare FFS

## 2018-03-11 ENCOUNTER — Other Ambulatory Visit (HOSPITAL_COMMUNITY)
Admission: RE | Admit: 2018-03-11 | Discharge: 2018-03-11 | Disposition: A | Discharge status: Home / Self Care | Payer: Medicare FFS | Source: Ambulatory Visit | Attending: Cardiovascular Disease | Admitting: Cardiovascular Disease

## 2018-03-11 ENCOUNTER — Encounter: Payer: Self-pay | Admitting: Cardiovascular Disease

## 2018-03-11 VITALS — BP 150/84 | HR 56 | Ht 63.0 in | Wt 124.2 lb

## 2018-03-11 DIAGNOSIS — I4892 Unspecified atrial flutter: Secondary | ICD-10-CM

## 2018-03-11 DIAGNOSIS — I429 Cardiomyopathy, unspecified: Secondary | ICD-10-CM | POA: Diagnosis not present

## 2018-03-11 DIAGNOSIS — R0602 Shortness of breath: Secondary | ICD-10-CM

## 2018-03-11 DIAGNOSIS — I447 Left bundle-branch block, unspecified: Secondary | ICD-10-CM | POA: Diagnosis not present

## 2018-03-11 DIAGNOSIS — I5022 Chronic systolic (congestive) heart failure: Secondary | ICD-10-CM | POA: Diagnosis not present

## 2018-03-11 LAB — BRAIN NATRIURETIC PEPTIDE: B Natriuretic Peptide: 238 pg/mL — ABNORMAL HIGH (ref 0.0–100.0)

## 2018-03-11 NOTE — Progress Notes (Signed)
SUBJECTIVE: The patient presents for routine follow up for chronic systolic heart failureand atrial flutter. She is 84 years old.  [place]  Her nephew is Dr. 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  Chest x-ray 12/07/2017: Stable chronic interstitial changes.  No acute infiltrate.  Stable cardiomegaly.  She has been feeling more weak this year since Christmas.  She has chronic exertional dyspnea but she thinks she is slightly more short of breath.  She sleeps on a hospital bed.  She denies leg swelling and chest pain.  She does use oxygen intermittently at home.  She takes Lasix daily but does not do so when she has to go out.  Oxygen saturations are 93% today in our office.  She is scheduled to have a chest CT on June 7.    Review of Systems: As per "subjective", otherwise negative.  No Known Allergies  Current Outpatient Medications  Medication Sig Dispense Refill  . beta carotene w/minerals (OCUVITE) tablet Take 1 tablet by mouth daily.     Marland Kitchen ELIQUIS 2.5 MG TABS tablet Take 1 tablet by mouth 2 (two) times daily.    . furosemide (LASIX) 40 MG tablet TAKE 1 TABLET EVERY DAY 90 tablet 1  . gabapentin (NEURONTIN) 100 MG capsule Take 200 mg by mouth 3 (three) times daily.     Marland Kitchen losartan (COZAAR) 50 MG tablet Take 50 mg by mouth daily.     . metoprolol tartrate (LOPRESSOR) 50 MG tablet TAKE 1 AND 1/2 TABLETS TWICE DAILY 270 tablet 3  . potassium chloride SA (K-DUR,KLOR-CON) 20 MEQ tablet Take 20 mEq by mouth daily.      No current facility-administered medications for this visit.     Past Medical History:  Diagnosis Date  . Cancer (HCC)    Lt Breast, Skin  . Fluttering heart   . Hypertension   . Pancreatitis, acute     Past Surgical History:  Procedure Laterality Date  . BREAST SURGERY    . CHOLECYSTECTOMY    . TONSILLECTOMY      Social History   Socioeconomic History  . Marital status: Single     Spouse name: Not on file  . Number of children: Not on file  . Years of education: Not on file  . Highest education level: Not on file  Occupational History  . Not on file  Social Needs  . Financial resource strain: Not on file  . Food insecurity:    Worry: Not on file    Inability: Not on file  . Transportation needs:    Medical: Not on file    Non-medical: Not on file  Tobacco Use  . Smoking status: Former Smoker    Packs/day: 0.50    Years: 40.00    Pack years: 20.00    Types: Cigarettes    Start date: 09/03/1942    Last attempt to quit: 09/03/1982    Years since quitting: 35.5  . Smokeless tobacco: Never Used  Substance and Sexual Activity  . Alcohol use: Yes    Alcohol/week: 5.4 oz    Types: 9 Glasses of wine per week    Comment: wine  . Drug use: No  . Sexual activity: Not on file  Lifestyle  . Physical activity:    Days per week: Not on file    Minutes per session: Not on file  . Stress: Not on  file  Relationships  . Social connections:    Talks on phone: Not on file    Gets together: Not on file    Attends religious service: Not on file    Active member of club or organization: Not on file    Attends meetings of clubs or organizations: Not on file    Relationship status: Not on file  . Intimate partner violence:    Fear of current or ex partner: Not on file    Emotionally abused: Not on file    Physically abused: Not on file    Forced sexual activity: Not on file  Other Topics Concern  . Not on file  Social History Narrative  . Not on file     Vitals:   03/11/18 1132  BP: (!) 150/84  Pulse: (!) 56  SpO2: 93%  Weight: 124 lb 3.2 oz (56.3 kg)  Height: 5\' 3"  (1.6 m)    Wt Readings from Last 3 Encounters:  03/11/18 124 lb 3.2 oz (56.3 kg)  12/07/17 125 lb (56.7 kg)  06/29/17 130 lb (59 kg)     PHYSICAL EXAM General: NAD HEENT: Normal. Neck: No JVD, no thyromegaly. Lungs: Faint bibasilar crackles (dry).  No wheezes. CV: Regular rate  and rhythm, normal S1/S2, no S3/S4, no murmur. No pretibial or periankle edema.  Abdomen: Soft, nontender, no distention.  Neurologic: Alert and oriented.  Psych: Normal affect. Skin: Normal. Musculoskeletal: No gross deformities.    ECG: Most recent ECG reviewed.   Labs: Lab Results  Component Value Date/Time   K 4.2 06/29/2017 01:58 PM   BUN 13 06/29/2017 01:58 PM   CREATININE 0.69 06/29/2017 01:58 PM   ALT 16 05/04/2015 10:20 AM   HGB 13.6 05/05/2015 06:17 AM     Lipids: No results found for: LDLCALC, LDLDIRECT, CHOL, TRIG, HDL     ASSESSMENT AND PLAN: 1.  Chronic systolic heart failure with shortness of breath: She is slightly more short of breath but she does have interstitial lung disease.  She is scheduled to have a chest CT on June 7.  I will obtain a BNP. Weight is stable.  Continue Lasix 40 mg daily.  She is also on losartan and metoprolol tartrate. No ischemia by stress testing in 08/2015. LVEF remains severely reduced at 30% with no marked changes since 2014. Has restrictive diastolic physiology.  2.  Chronic atrial flutter: Heart rate is controlled on present therapy with metoprolol 75 mg twice daily.  Systemic anticoagulation is being achieved with low-dose Eliquis 2.5 mg twice daily.  No changes.  3.  Chronic hypertension: Blood pressure is mildly elevated today but normal at last visit.  No changes to therapy.  4.  Left bundle branch block: This is chronic.      Disposition: Follow up 6 months   Kate Sable, M.D., F.A.C.C.

## 2018-03-11 NOTE — Patient Instructions (Signed)
Medication Instructions:  Your physician recommends that you continue on your current medications as directed. Please refer to the Current Medication list given to you today.   Labwork: Your physician recommends that you return for lab work in: Today     Testing/Procedures: NONE   Follow-Up: Your physician recommends that you schedule a follow-up appointment in: 6 Months    Any Other Special Instructions Will Be Listed Below (If Applicable).     If you need a refill on your cardiac medications before your next appointment, please call your pharmacy.  Thank you for choosing [facility]!

## 2018-03-18 ENCOUNTER — Ambulatory Visit (HOSPITAL_COMMUNITY)
Admission: RE | Admit: 2018-03-18 | Discharge: 2018-03-18 | Disposition: A | Discharge status: Home / Self Care | Payer: Medicare FFS | Source: Ambulatory Visit | Attending: Internal Medicine | Admitting: Internal Medicine

## 2018-03-18 DIAGNOSIS — J8410 Pulmonary fibrosis, unspecified: Secondary | ICD-10-CM | POA: Insufficient documentation

## 2018-03-18 DIAGNOSIS — I2510 Atherosclerotic heart disease of native coronary artery without angina pectoris: Secondary | ICD-10-CM | POA: Diagnosis not present

## 2018-03-18 DIAGNOSIS — R918 Other nonspecific abnormal finding of lung field: Secondary | ICD-10-CM | POA: Insufficient documentation

## 2018-03-18 DIAGNOSIS — N281 Cyst of kidney, acquired: Secondary | ICD-10-CM | POA: Insufficient documentation

## 2018-03-18 DIAGNOSIS — I517 Cardiomegaly: Secondary | ICD-10-CM | POA: Insufficient documentation

## 2018-03-18 DIAGNOSIS — R590 Localized enlarged lymph nodes: Secondary | ICD-10-CM | POA: Insufficient documentation

## 2018-03-18 DIAGNOSIS — I700 Atherosclerosis of aorta: Secondary | ICD-10-CM | POA: Insufficient documentation

## 2018-03-21 ENCOUNTER — Other Ambulatory Visit (HOSPITAL_COMMUNITY): Payer: Self-pay | Admitting: Internal Medicine

## 2018-03-21 DIAGNOSIS — R918 Other nonspecific abnormal finding of lung field: Secondary | ICD-10-CM

## 2018-04-04 ENCOUNTER — Encounter (HOSPITAL_COMMUNITY)
Admission: RE | Admit: 2018-04-04 | Discharge: 2018-04-04 | Disposition: A | Discharge status: Home / Self Care | Payer: Medicare FFS | Source: Ambulatory Visit | Attending: Internal Medicine | Admitting: Internal Medicine

## 2018-04-04 DIAGNOSIS — R918 Other nonspecific abnormal finding of lung field: Secondary | ICD-10-CM | POA: Insufficient documentation

## 2018-04-04 MED ORDER — FLUDEOXYGLUCOSE F - 18 (FDG) INJECTION
10.0000 | Freq: Once | INTRAVENOUS | Status: AC | PRN
  Administered 2018-04-04: 9.29 via INTRAVENOUS

## 2018-07-11 ENCOUNTER — Other Ambulatory Visit (HOSPITAL_COMMUNITY): Payer: Self-pay | Admitting: Internal Medicine

## 2018-07-11 DIAGNOSIS — R911 Solitary pulmonary nodule: Secondary | ICD-10-CM

## 2018-07-28 ENCOUNTER — Ambulatory Visit (HOSPITAL_COMMUNITY)
Admission: RE | Admit: 2018-07-28 | Discharge: 2018-07-28 | Disposition: A | Discharge status: Home / Self Care | Payer: Medicare FFS | Source: Ambulatory Visit | Attending: Internal Medicine | Admitting: Internal Medicine

## 2018-07-28 DIAGNOSIS — R911 Solitary pulmonary nodule: Secondary | ICD-10-CM

## 2018-07-28 DIAGNOSIS — J439 Emphysema, unspecified: Secondary | ICD-10-CM | POA: Diagnosis not present

## 2018-07-28 DIAGNOSIS — I700 Atherosclerosis of aorta: Secondary | ICD-10-CM | POA: Insufficient documentation

## 2018-09-16 ENCOUNTER — Encounter: Payer: Self-pay | Admitting: Cardiovascular Disease

## 2018-09-16 ENCOUNTER — Ambulatory Visit: Payer: Medicare FFS | Admitting: Cardiovascular Disease

## 2018-09-16 VITALS — BP 120/70 | HR 77 | Ht 64.0 in | Wt 127.0 lb

## 2018-09-16 DIAGNOSIS — I10 Essential (primary) hypertension: Secondary | ICD-10-CM

## 2018-09-16 DIAGNOSIS — I4892 Unspecified atrial flutter: Secondary | ICD-10-CM

## 2018-09-16 DIAGNOSIS — R0602 Shortness of breath: Secondary | ICD-10-CM | POA: Diagnosis not present

## 2018-09-16 DIAGNOSIS — I429 Cardiomyopathy, unspecified: Secondary | ICD-10-CM

## 2018-09-16 DIAGNOSIS — I447 Left bundle-branch block, unspecified: Secondary | ICD-10-CM | POA: Diagnosis not present

## 2018-09-16 DIAGNOSIS — I5022 Chronic systolic (congestive) heart failure: Secondary | ICD-10-CM | POA: Diagnosis not present

## 2018-09-16 NOTE — Patient Instructions (Signed)

## 2018-09-16 NOTE — Progress Notes (Signed)
SUBJECTIVE: The patient presents for routine follow up for chronic systolic heart failureand atrial flutter. She is 84years old.  [place] Her nephew is Dr. 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  I reviewed the chest CT performed on 07/28/2018 which demonstrated improved/resolution of previously noted right lower lobe consolidation with some fibrosis.  There were stable right and left lower lobe nodules and pleural-based right middle lobe nodule.  They appear to be benign.  Advanced emphysema and interstitial fibrosis was noted.  She has occasional chest pains lasting seconds and she notes some tenderness of the epigastric region.  She denies nausea.  She had some left shoulder pain which she believes is chronic.  She said her appetite is not what it used to be.  She gets some assistance with cooking at home.  She becomes very short of breath with exertion but is okay sitting down.  ECG performed today which I personally reviewed demonstrates atrial flutter with left bundle branch block.    Review of Systems: As per "subjective", otherwise negative.  No Known Allergies  Current Outpatient Medications  Medication Sig Dispense Refill  . beta carotene w/minerals (OCUVITE) tablet Take 1 tablet by mouth daily.     Marland Kitchen ELIQUIS 2.5 MG TABS tablet Take 1 tablet by mouth 2 (two) times daily.    . furosemide (LASIX) 40 MG tablet TAKE 1 TABLET EVERY DAY 90 tablet 1  . gabapentin (NEURONTIN) 100 MG capsule Take 200 mg by mouth 3 (three) times daily.     Marland Kitchen losartan (COZAAR) 50 MG tablet Take 50 mg by mouth daily.     . metoprolol tartrate (LOPRESSOR) 50 MG tablet TAKE 1 AND 1/2 TABLETS TWICE DAILY 270 tablet 3  . potassium chloride SA (K-DUR,KLOR-CON) 20 MEQ tablet Take 20 mEq by mouth daily.      No current facility-administered medications for this visit.     Past Medical History:  Diagnosis Date  . Cancer (HCC)    Lt Breast, Skin  . Fluttering heart   . Hypertension   . Pancreatitis, acute     Past Surgical History:  Procedure Laterality Date  . BREAST SURGERY    . CHOLECYSTECTOMY    . TONSILLECTOMY      Social History   Socioeconomic History  . Marital status: Single    Spouse name: Not on file  . Number of children: Not on file  . Years of education: Not on file  . Highest education level: Not on file  Occupational History  . Not on file  Social Needs  . Financial resource strain: Not on file  . Food insecurity:    Worry: Not on file    Inability: Not on file  . Transportation needs:    Medical: Not on file    Non-medical: Not on file  Tobacco Use  . Smoking status: Former Smoker    Packs/day: 0.50    Years: 40.00    Pack years: 20.00    Types: Cigarettes    Start date: 09/03/1942    Last attempt to quit: 09/03/1982    Years since quitting: 36.0  . Smokeless tobacco: Never Used  Substance and Sexual Activity  . Alcohol use: Yes    Alcohol/week: 9.0 standard drinks    Types: 9 Glasses of wine per week    Comment: wine  . Drug use: No  . Sexual activity:  Not on file  Lifestyle  . Physical activity:    Days per week: Not on file    Minutes per session: Not on file  . Stress: Not on file  Relationships  . Social connections:    Talks on phone: Not on file    Gets together: Not on file    Attends religious service: Not on file    Active member of club or organization: Not on file    Attends meetings of clubs or organizations: Not on file    Relationship status: Not on file  . Intimate partner violence:    Fear of current or ex partner: Not on file    Emotionally abused: Not on file    Physically abused: Not on file    Forced sexual activity: Not on file  Other Topics Concern  . Not on file  Social History Narrative  . Not on file     Vitals:   09/16/18 1049  BP: 120/70  Pulse: 77  SpO2: 91%  Weight: 127 lb (57.6 kg)  Height: 5\' 4"  (1.626 m)    Wt  Readings from Last 3 Encounters:  09/16/18 127 lb (57.6 kg)  03/11/18 124 lb 3.2 oz (56.3 kg)  12/07/17 125 lb (56.7 kg)     PHYSICAL EXAM General: NAD HEENT: Normal. Neck: No JVD, no thyromegaly. Lungs: Diminished effort without crackles or wheezes. CV: Regular rate and rhythm, normal S1/S2, no S3/S4, no murmur. No pretibial or periankle edema.    Abdomen: Soft, nontender, no distention.  Neurologic: Alert and oriented.  Psych: Normal affect. Skin: Normal. Musculoskeletal: No gross deformities.    ECG: Reviewed above under Subjective   Labs: Lab Results  Component Value Date/Time   K 4.2 06/29/2017 01:58 PM   BUN 13 06/29/2017 01:58 PM   CREATININE 0.69 06/29/2017 01:58 PM   ALT 16 05/04/2015 10:20 AM   HGB 13.6 05/05/2015 06:17 AM     Lipids: No results found for: LDLCALC, LDLDIRECT, CHOL, TRIG, HDL     ASSESSMENT AND PLAN:  1. Chronic systolic heart failure:  Weight is stable. She has significant exertional dyspnea which is also related to advanced COPD and pulmonary fibrosis.  Continue Lasix 40 mg daily along with losartan and metoprolol tartrate. No ischemia by stress testing in 08/2015. LVEF remains severely reduced at 30% with no marked changes since 2014. Has restrictive diastolic physiology. I will request records from Dr. Ria Comment office regarding her most recent office visit.  2. Chronic atrial flutter: Heart rate is controlled on present therapy with metoprolol 75 mg twice daily. Systemic anticoagulation is being achieved with low-dose Eliquis 2.5 mg twice daily. No changes.  3. Chronic hypertension: Blood pressure is  normal.  No changes to therapy.  4. Left bundle branch block: This is chronic.   Disposition: Follow up 6 months   Kate Sable, M.D., F.A.C.C.

## 2018-10-24 ENCOUNTER — Other Ambulatory Visit: Payer: Self-pay | Admitting: Cardiovascular Disease

## 2019-01-30 ENCOUNTER — Other Ambulatory Visit: Payer: Self-pay | Admitting: Cardiovascular Disease

## 2019-07-24 ENCOUNTER — Other Ambulatory Visit: Payer: Self-pay | Admitting: Cardiovascular Disease

## 2019-10-18 ENCOUNTER — Other Ambulatory Visit: Payer: Self-pay | Admitting: Cardiovascular Disease

## 2019-11-26 ENCOUNTER — Telehealth: Payer: Self-pay | Admitting: Emergency Medicine

## 2019-11-26 ENCOUNTER — Encounter: Payer: Self-pay | Admitting: Obstetrics and Gynecology

## 2019-12-22 ENCOUNTER — Telehealth: Payer: Self-pay

## 2019-12-22 NOTE — Telephone Encounter (Signed)

## 2020-01-03 ENCOUNTER — Telehealth (INDEPENDENT_AMBULATORY_CARE_PROVIDER_SITE_OTHER): Payer: Medicare FFS | Admitting: Cardiovascular Disease

## 2020-01-03 ENCOUNTER — Encounter: Payer: Self-pay | Admitting: Cardiovascular Disease

## 2020-01-03 VITALS — Ht 64.0 in | Wt 123.0 lb

## 2020-01-03 DIAGNOSIS — I5022 Chronic systolic (congestive) heart failure: Secondary | ICD-10-CM | POA: Diagnosis not present

## 2020-01-03 DIAGNOSIS — I4892 Unspecified atrial flutter: Secondary | ICD-10-CM

## 2020-01-03 DIAGNOSIS — I447 Left bundle-branch block, unspecified: Secondary | ICD-10-CM

## 2020-01-03 DIAGNOSIS — I429 Cardiomyopathy, unspecified: Secondary | ICD-10-CM

## 2020-01-03 DIAGNOSIS — I10 Essential (primary) hypertension: Secondary | ICD-10-CM

## 2020-01-03 NOTE — Patient Instructions (Signed)
Medication Instructions:  Your physician recommends that you continue on your current medications as directed. Please refer to the Current Medication list given to you today.  *If you need a refill on your cardiac medications before your next appointment, please call your pharmacy*   Lab Work: None today  If you have labs (blood work) drawn today and your tests are completely normal, you will receive your results only by: . MyChart Message (if you have MyChart) OR . A paper copy in the mail If you have any lab test that is abnormal or we need to change your treatment, we will call you to review the results.   Testing/Procedures: None today    Follow-Up: At [facility], you and your health needs are our priority.  As part of our continuing mission to provide you with exceptional heart care, we have created designated Provider Care Teams.  These Care Teams include your primary Cardiologist (physician) and Advanced Practice Providers (APPs -  Physician Assistants and Nurse Practitioners) who all work together to provide you with the care you need, when you need it.  We recommend signing up for the patient portal called "MyChart".  Sign up information is provided on this After Visit Summary.  MyChart is used to connect with patients for Virtual Visits (Telemedicine).  Patients are able to view lab/test results, encounter notes, upcoming appointments, etc.  Non-urgent messages can be sent to your provider as well.   To learn more about what you can do with MyChart, go to [email or web address].    Your next appointment:   6 month(s)  The format for your next appointment:   Virtual Visit   Provider:   Suresh Koneswaran, MD   Other Instructions None      Thank you for choosing [facility] !         

## 2020-01-03 NOTE — Progress Notes (Signed)
Virtual Visit via Telephone Note   This visit type was conducted due to national recommendations for restrictions regarding the COVID-19 Pandemic (e.g. social distancing) in an effort to limit this patient's exposure and mitigate transmission in our community.  Due to her co-morbid illnesses, this patient is at least at moderate risk for complications without adequate follow up.  This format is felt to be most appropriate for this patient at this time.  The patient did not have access to video technology/had technical difficulties with video requiring transitioning to audio format only (telephone).  All issues noted in this document were discussed and addressed.  No physical exam could be performed with this format.  Please refer to the patient's chart for her  consent to telehealth for [facility].   The patient was identified using 2 identifiers.  Date:  01/03/2020   ID:  [name], DOB [birth year], MRN [id number]  Patient Location: Home Provider Location: Office  PCP:  Asencion Noble, MD  Cardiologist:  Kate Sable, MD  Electrophysiologist:  None   Evaluation Performed:  Follow-Up Visit  Chief Complaint:  Chronic systolic heart failureand atrial flutter  History of Present Illness:    [name] is a 86 y.o. female with chronic systolic heart failureand atrial flutter.  She also has advanced COPD and pulmonary fibrosis.  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 Her nephew is Dr. Orlean Patten he checks on her regularly and helps to take care of the farm.  She says "I feel old but nothing hurts me".   She denies chest pain, shortness of breath, palpitations, and leg swelling.  She says [name] sits in another room with a mask on when he visits and she wears a mask, too.  She got both doses of the COVID19 vaccine.  She says her appetite is poor. 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 She listens to church sermons on the radio based out of [place].  She  never married.   Past Medical History:  Diagnosis Date  . Cancer (HCC)    Lt Breast, Skin  . Fluttering heart   . Hypertension   . Pancreatitis, acute    Past Surgical History:  Procedure Laterality Date  . BREAST SURGERY    . CHOLECYSTECTOMY    . TONSILLECTOMY       Current Meds  Medication Sig  . beta carotene w/minerals (OCUVITE) tablet Take 1 tablet by mouth daily.   Marland Kitchen ELIQUIS 2.5 MG TABS tablet Take 1 tablet by mouth 2 (two) times daily.  . furosemide (LASIX) 40 MG tablet TAKE 1 TABLET EVERY DAY  . gabapentin (NEURONTIN) 100 MG capsule Take 200 mg by mouth 2 (two) times daily.  Marland Kitchen losartan (COZAAR) 50 MG tablet Take 50 mg by mouth daily.   . metoprolol tartrate (LOPRESSOR) 50 MG tablet TAKE 1 AND 1/2 TABLETS TWICE DAILY  . potassium chloride SA (K-DUR,KLOR-CON) 20 MEQ tablet Take 20 mEq by mouth daily.      Allergies:   Patient has no known allergies.   Social History   Tobacco Use  . Smoking status: Former Smoker    Packs/day: 0.50    Years: 40.00    Pack years: 20.00    Types: Cigarettes    Start date: 09/03/1942    Quit date: 09/03/1982    Years since quitting: 37.3  . Smokeless tobacco: Never Used  Substance Use Topics  . Alcohol use: Yes    Alcohol/week: 9.0 standard drinks  Types: 9 Glasses of wine per week    Comment: wine  . Drug use: No     Family Hx: The patient's family history includes Cancer in her paternal aunt; Heart attack in her paternal grandmother; Heart disease in her paternal grandmother; Heart failure in her paternal grandmother; Hypertension in her mother.  ROS:   Please see the history of present illness.     All other systems reviewed and are negative.   Prior CV studies:   The following studies were reviewed today:  Echocardiogram 08/30/2015:  Left ventricle: The cavity size was normal. Wall thickness was  increased in a pattern of mild LVH. Systolic function was  severely reduced. The estimated ejection fraction  was  approximately 30%. Doppler parameters are consistent with  restrictive physiology, indicative of decreased left ventricular  diastolic compliance and/or increased left atrial pressure.  Doppler parameters are consistent with high ventricular filling  pressure.  - Ventricular septum: Septal motion showed abnormal function and  dyssynergy. These changes are consistent with a left bundle  branch block.  - Aortic valve: Mildly calcified annulus. Trileaflet; mildly  thickened leaflets.  - Mitral valve: Mildly thickened leaflets . There was mild  regurgitation.  - Left atrium: The atrium was moderately dilated.  - Right ventricle: Systolic function was mildly reduced.  - Right atrium: The atrium was moderately dilated.  - Atrial septum: The septum bowed from left to right, consistent  with increased left atrial pressure.  - Tricuspid valve: There was mild regurgitation.  - Pulmonic valve: There was mild regurgitation.  - Pulmonary arteries: Systolic pressure was mild to moderately  increased. PA peak pressure: 44 mm Hg (S).  - Systemic veins: Dilated IVC with normal respiratory variation.  Estimated CVP 8 mmHg.  - Pericardium, extracardiac: A trivial pericardial effusion was  identified.   Labs/Other Tests and Data Reviewed:    EKG:  No ECG reviewed.  Recent Labs: No results found for requested labs within last 8760 hours.   Recent Lipid Panel No results found for: CHOL, TRIG, HDL, CHOLHDL, LDLCALC, LDLDIRECT  Wt Readings from Last 3 Encounters:  01/03/20 123 lb (55.8 kg)  09/16/18 127 lb (57.6 kg)  03/11/18 124 lb 3.2 oz (56.3 kg)     Objective:    Vital Signs:  Ht 5\' 4"  (1.626 m)   Wt 123 lb (55.8 kg)   BMI 21.11 kg/m    VITAL SIGNS:  reviewed  ASSESSMENT & PLAN:    1.  Chronic systolic heart failure: Weight is relatively stable.  She remains on Lasix 40 mg daily along with metoprolol tartrate and losartan.  No ischemia by stress  testing in November 2016.  LVEF 30% by echocardiogram in November 2016.  She has restrictive diastolic physiology.  2.  Chronic atrial flutter: Continue Lopressor for heart rate control.  She is on low-dose Eliquis 2.5 mg twice daily for systemic anticoagulation.  3.  Left bundle branch block: This is chronic.  4.  Hypertension: She remains on losartan and Lopressor for blood pressure control.    COVID-19 Education: The signs and symptoms of COVID-19 were discussed with the patient and how to seek care for testing (follow up with PCP or arrange E-visit).  The importance of social distancing was discussed today.  Time:   Today, I have spent 20 minutes with the patient with telehealth technology discussing the above problems.     Medication Adjustments/Labs and Tests Ordered: Current medicines are reviewed at length with the patient today.  Concerns regarding medicines are outlined above.   Tests Ordered: No orders of the defined types were placed in this encounter.   Medication Changes: No orders of the defined types were placed in this encounter.   Follow Up:  Virtual Visit  in 6 month(s)  Signed, Kate Sable, MD  01/03/2020 12:36 PM    [place]

## 2020-01-31 ENCOUNTER — Other Ambulatory Visit: Payer: Self-pay | Admitting: Cardiovascular Disease

## 2020-04-22 ENCOUNTER — Other Ambulatory Visit (HOSPITAL_COMMUNITY): Payer: Self-pay | Admitting: Internal Medicine

## 2020-04-22 DIAGNOSIS — R310 Gross hematuria: Secondary | ICD-10-CM

## 2020-05-03 ENCOUNTER — Encounter (HOSPITAL_COMMUNITY): Payer: Self-pay

## 2020-05-03 ENCOUNTER — Ambulatory Visit (HOSPITAL_COMMUNITY)
Admission: RE | Admit: 2020-05-03 | Discharge: 2020-05-03 | Disposition: A | Discharge status: Home / Self Care | Payer: Medicare FFS | Source: Ambulatory Visit | Attending: Internal Medicine | Admitting: Internal Medicine

## 2020-05-03 ENCOUNTER — Other Ambulatory Visit: Payer: Self-pay

## 2020-05-03 DIAGNOSIS — R310 Gross hematuria: Secondary | ICD-10-CM | POA: Insufficient documentation

## 2020-05-03 LAB — POCT I-STAT CREATININE: Creatinine, Ser: 0.8 mg/dL (ref 0.44–1.00)

## 2020-05-03 MED ORDER — IOHEXOL 300 MG/ML  SOLN
75.0000 mL | Freq: Once | INTRAMUSCULAR | Status: AC | PRN
  Administered 2020-05-03: 75 mL via INTRAVENOUS

## 2020-06-07 ENCOUNTER — Encounter: Payer: Self-pay | Admitting: Urology

## 2020-06-07 ENCOUNTER — Ambulatory Visit (INDEPENDENT_AMBULATORY_CARE_PROVIDER_SITE_OTHER): Payer: Medicare FFS | Admitting: Urology

## 2020-06-07 ENCOUNTER — Other Ambulatory Visit: Payer: Medicare FFS | Admitting: Urology

## 2020-06-07 ENCOUNTER — Other Ambulatory Visit: Payer: Self-pay

## 2020-06-07 VITALS — BP 146/76 | HR 63 | Temp 97.5°F | Ht 62.0 in | Wt 123.0 lb

## 2020-06-07 DIAGNOSIS — R310 Gross hematuria: Secondary | ICD-10-CM | POA: Diagnosis not present

## 2020-06-07 LAB — URINALYSIS, ROUTINE W REFLEX MICROSCOPIC
Bilirubin, UA: NEGATIVE
Glucose, UA: NEGATIVE
Leukocytes,UA: NEGATIVE
Nitrite, UA: NEGATIVE
Specific Gravity, UA: >1.03 — ABNORMAL HIGH (ref 1.005–1.030)
Urobilinogen, Ur: 4 mg/dL — ABNORMAL HIGH (ref 0.2–1.0)
pH, UA: 8.5 — ABNORMAL HIGH (ref 5.0–7.5)

## 2020-06-07 MED ORDER — CIPROFLOXACIN HCL 500 MG PO TABS: 500.0000 mg | ORAL_TABLET | Freq: Once | ORAL | Status: DC

## 2020-06-07 NOTE — Progress Notes (Signed)

## 2020-06-07 NOTE — Patient Instructions (Signed)
Hematuria, Adult Hematuria is blood in the urine. Blood may be visible in the urine, or it may be identified with a test. This condition can be caused by infections of the bladder, urethra, kidney, or prostate. Other possible causes include:  Kidney stones.  Cancer of the urinary tract.  Too much calcium in the urine.  Conditions that are passed from parent to child (inherited conditions).  Exercise that requires a lot of energy. Infections can usually be treated with medicine, and a kidney stone usually will pass through your urine. If neither of these is the cause of your hematuria, more tests may be needed to identify the cause of your symptoms. It is very important to tell your health care provider about any blood in your urine, even if it is painless or the blood stops without treatment. Blood in the urine, when it happens and then stops and then happens again, can be a symptom of a very serious condition, including cancer. There is no pain in the initial stages of many urinary cancers. Follow these instructions at home: Medicines  Take over-the-counter and prescription medicines only as told by your health care provider.  If you were prescribed an antibiotic medicine, take it as told by your health care provider. Do not stop taking the antibiotic even if you start to feel better. Eating and drinking  Drink enough fluid to keep your urine clear or pale yellow. It is recommended that you drink 3-4 quarts (2.8-3.8 L) a day. If you have been diagnosed with an infection, it is recommended that you drink cranberry juice in addition to large amounts of water.  Avoid caffeine, tea, and carbonated beverages. These tend to irritate the bladder.  Avoid alcohol because it may irritate the prostate (men). General instructions  If you have been diagnosed with a kidney stone, follow your health care provider's instructions about straining your urine to catch the stone.  Empty your bladder  often. Avoid holding urine for long periods of time.  If you are female: ? After a bowel movement, wipe from front to back and use each piece of toilet paper only once. ? Empty your bladder before and after sex.  Pay attention to any changes in your symptoms. Tell your health care provider about any changes or any new symptoms.  It is your responsibility to get your test results. Ask your health care provider, or the department performing the test, when your results will be ready.  Keep all follow-up visits as told by your health care provider. This is important. Contact a health care provider if:  You develop back pain.  You have a fever.  You have nausea or vomiting.  Your symptoms do not improve after 3 days.  Your symptoms get worse. Get help right away if:  You develop severe vomiting and are unable take medicine without vomiting.  You develop severe pain in your back or abdomen even though you are taking medicine.  You pass a large amount of blood in your urine.  You pass blood clots in your urine.  You feel very weak or like you might faint.  You faint. Summary  Hematuria is blood in the urine. It has many possible causes.  It is very important that you tell your health care provider about any blood in your urine, even if it is painless or the blood stops without treatment.  Take over-the-counter and prescription medicines only as told by your health care provider.  Drink enough fluid to keep   your urine clear or pale yellow. This information is not intended to replace advice given to you by your health care provider. Make sure you discuss any questions you have with your health care provider. Document Revised: 02/22/2019 Document Reviewed: 10/31/2016 Elsevier Patient Education  2020 Elsevier Inc.  

## 2020-06-07 NOTE — Progress Notes (Signed)
Surgical clearance sent to Dr. Willey Blade

## 2020-06-07 NOTE — Progress Notes (Signed)
06/07/2020 12:26 PM   [name] 1917-09-06 [id number]  Referring provider: Asencion Noble, MD [address] [place],  [place] [address]  Gross hematuria  HPI:  [name] is a 86 year old here for evaluation of gross hematuria. 6-7 weeks ago she developed gross hematuria with associated urinary frequency, urgency and dysuria. Dr. Willey Blade ordered a CT abd/pelvis which showed no upper tract abnormalities but she had a right lateral thickening of the bladder wall. NO exposure risks. No other associated symptoms. The hematuria occurs daily. No exacerbating/alleviaitng events  PMH: Past Medical History:  Diagnosis Date  . Cancer (HCC)    Lt Breast, Skin  . Fluttering heart   . Hypertension   . Pancreatitis, acute     Surgical History: Past Surgical History:  Procedure Laterality Date  . BREAST SURGERY    . CHOLECYSTECTOMY    . TONSILLECTOMY      Home Medications:  Allergies as of 06/07/2020   No Known Allergies     Medication List       Accurate as of June 07, 2020 12:26 PM. If you have any questions, ask your nurse or doctor.        beta carotene w/minerals tablet Take 1 tablet by mouth daily.   Eliquis 2.5 MG Tabs tablet Generic drug: apixaban Take 1 tablet by mouth 2 (two) times daily.   furosemide 40 MG tablet Commonly known as: LASIX TAKE 1 TABLET EVERY DAY   gabapentin 100 MG capsule Commonly known as: NEURONTIN Take 200 mg by mouth 2 (two) times daily.   losartan 50 MG tablet Commonly known as: COZAAR Take 50 mg by mouth daily.   metoprolol tartrate 50 MG tablet Commonly known as: LOPRESSOR TAKE 1 AND 1/2 TABLETS TWICE DAILY   potassium chloride SA 20 MEQ tablet Commonly known as: KLOR-CON Take 20 mEq by mouth daily.       Allergies: No Known Allergies  Family History: Family History  Problem Relation Age of Onset  . Hypertension Mother   . Cancer Paternal Aunt   . Heart failure Paternal Grandmother   . Heart  disease Paternal Grandmother   . Heart attack Paternal Grandmother     Social History:  reports that she quit smoking about 37 years ago. Her smoking use included cigarettes. She started smoking about 77 years ago. She has a 20.00 pack-year smoking history. She has never used smokeless tobacco. She reports current alcohol use of about 9.0 standard drinks of alcohol per week. She reports that she does not use drugs.  ROS: All other review of systems were reviewed and are negative except what is noted above in HPI  Physical Exam: BP (!) 146/76   Pulse 63   Temp (!) 97.5 F (36.4 C)   Ht 5\' 2"  (1.575 m)   Wt 123 lb (55.8 kg)   BMI 22.50 kg/m   Constitutional:  Alert and oriented, No acute distress. HEENT: [place] AT, moist mucus membranes.  Trachea midline, no masses. Cardiovascular: No clubbing, cyanosis, or edema. Respiratory: Normal respiratory effort, no increased work of breathing. GI: Abdomen is soft, nontender, nondistended, no abdominal masses GU: No CVA tenderness.  Lymph: No cervical or inguinal lymphadenopathy. Skin: No rashes, bruises or suspicious lesions. Neurologic: Grossly intact, no focal deficits, moving all 4 extremities. Psychiatric: Normal mood and affect.  Laboratory Data: Lab Results  Component Value Date   WBC 9.3 05/05/2015   HGB 13.6 05/05/2015   HCT 40.5 05/05/2015   MCV 96.9  05/05/2015   PLT 203 05/05/2015    Lab Results  Component Value Date   CREATININE 0.80 05/03/2020    No results found for: PSA  No results found for: TESTOSTERONE  No results found for: HGBA1C  Urinalysis No results found for: COLORURINE, APPEARANCEUR, LABSPEC, PHURINE, GLUCOSEU, HGBUR, BILIRUBINUR, KETONESUR, PROTEINUR, UROBILINOGEN, NITRITE, LEUKOCYTESUR  No results found for: LABMICR, [place], RBCUA, LABEPIT, MUCUS, BACTERIA  Pertinent Imaging: CT abd, pelvis 05/03/2020: Images reviewed and discussed with the patient No results found for this or any previous  visit.  No results found for this or any previous visit.  No results found for this or any previous visit.  No results found for this or any previous visit.  No results found for this or any previous visit.  No results found for this or any previous visit.  No results found for this or any previous visit.  No results found for this or any previous visit.    Cystoscopy Procedure Note  Patient identification was confirmed, informed consent was obtained, and patient was prepped using Betadine solution.  Lidocaine jelly was administered per urethral meatus.    Procedure: - Flexible cystoscope introduced, without any difficulty.   - Thorough search of the bladder revealed:    normal urethral meatus    normal urothelium    no stones    no ulcers     no urethral polyps    no trabeculation 3cm papillary bladder involving the right lateral wall and right ureteral orifice  - Ureteral orifices were normal in position and appearance.  Post-Procedure: - Patient tolerated the procedure well    Assessment & Plan:    1. Gross hematuria/Bladder Tumor -We discussed the management of bladder tumor including transurethral resection. After discussing the treatment options the patient elects to proceed with surgery - ciprofloxacin (CIPRO) tablet 500 mg - Urinalysis, Routine w reflex microscopic   No follow-ups on file.  Nicolette Bang, MD  [facility] [place]

## 2020-06-19 NOTE — Telephone Encounter (Signed)
This encounter was created in error - please disregard.

## 2020-06-27 ENCOUNTER — Ambulatory Visit (HOSPITAL_COMMUNITY): Payer: Medicare FFS | Admitting: Anesthesiology

## 2020-06-27 NOTE — Patient Instructions (Signed)
RONIYAH LLORENS  06/27/2020     @PREFPERIOPPHARMACY @   Your procedure is scheduled on 07/02/2020.  Report to [facility] at 10:50 A.M.  Call this number if you have problems the morning of surgery:  [phone]   Remember:  Do not eat or drink after midnight.        Take these medicines the morning of surgery with A SIP OF WATER : metoprolol and gabapentin    Do not wear jewelry, make-up or nail polish.  Do not wear lotions, powders, or perfumes, or deodorant.  Do not shave 48 hours prior to surgery.  Men may shave face and neck.  Do not bring valuables to the hospital.  [facility] is not responsible for any belongings or valuables.  Contacts, dentures or bridgework may not be worn into surgery.  Leave your suitcase in the car.  After surgery it may be brought to your room.  For patients admitted to the hospital, discharge time will be determined by your treatment team.  Patients discharged the day of surgery will not be allowed to drive home.   Name and phone number of your driver:   family Special instructions:  n/A  Please read over the following fact sheets that you were given. Care and Recovery After Surgery  General Anesthesia, Adult General anesthesia is the use of medicines to make a person "go to sleep" (unconscious) for a medical procedure. General anesthesia must be used for certain procedures, and is often recommended for procedures that:  Last a long time.  Require you to be still or in an unusual position.  Are major and can cause blood loss. The medicines used for general anesthesia are called general anesthetics. As well as making you unconscious for a certain amount of time, these medicines:  Prevent pain.  Control your blood pressure.  Relax your muscles. Tell a health care provider about:  Any allergies you have.  All medicines you are taking, including vitamins, herbs, eye drops, creams, and over-the-counter medicines.  Any problems  you or family members have had with anesthetic medicines.  Types of anesthetics you have had in the past.  Any blood disorders you have.  Any surgeries you have had.  Any medical conditions you have.  Any recent upper respiratory, chest, or ear infections.  Any history of: ? Heart or lung conditions, such as heart failure, sleep apnea, asthma, or chronic obstructive pulmonary disease (COPD). ? [employer] forces logistics/support/administrative officer. ? Depression or anxiety.  Any tobacco or drug use, including marijuana or alcohol use.  Whether you are pregnant or may be pregnant. What are the risks? Generally, this is a safe procedure. However, problems may occur, including:  Allergic reaction.  Lung and heart problems.  Inhaling food or liquid from the stomach into the lungs (aspiration).  Nerve injury.  Dental injury.  Air in the bloodstream, which can lead to stroke.  Extreme agitation or confusion (delirium) when you wake up from the anesthetic.  Waking up during your procedure and being unable to move. This is rare. These problems are more likely to develop if you are having a major surgery or if you have an advanced or serious medical condition. You can prevent some of these complications by answering all of your health care provider's questions thoroughly and by following all instructions before your procedure. General anesthesia can cause side effects, including:  Nausea or vomiting.  A sore throat from the breathing tube.  Hoarseness.  Wheezing or coughing.  Shaking  chills.  Tiredness.  Body aches.  Anxiety.  Sleepiness or drowsiness.  Confusion or agitation. What happens before the procedure? Staying hydrated Follow instructions from your health care provider about hydration, which may include:  Up to 2 hours before the procedure - you may continue to drink clear liquids, such as water, clear fruit juice, black coffee, and plain tea.  Eating and drinking restrictions Follow  instructions from your health care provider about eating and drinking, which may include:  8 hours before the procedure - stop eating heavy meals or foods such as meat, fried foods, or fatty foods.  6 hours before the procedure - stop eating light meals or foods, such as toast or cereal.  6 hours before the procedure - stop drinking milk or drinks that contain milk.  2 hours before the procedure - stop drinking clear liquids. Medicines Ask your health care provider about:  Changing or stopping your regular medicines. This is especially important if you are taking diabetes medicines or blood thinners.  Taking medicines such as aspirin and ibuprofen. These medicines can thin your blood. Do not take these medicines unless your health care provider tells you to take them.  Taking over-the-counter medicines, vitamins, herbs, and supplements. Do not take these during the week before your procedure unless your health care provider approves them. General instructions  Starting 3-6 weeks before the procedure, do not use any products that contain nicotine or tobacco, such as cigarettes and e-cigarettes. If you need help quitting, ask your health care provider.  If you brush your teeth on the morning of the procedure, make sure to spit out all of the toothpaste.  Tell your health care provider if you become ill or develop a cold, cough, or fever.  If instructed by your health care provider, bring your sleep apnea device with you on the day of your surgery (if applicable).  Ask your health care provider if you will be going home the same day, the following day, or after a longer hospital stay. ? Plan to have someone take you home from the hospital or clinic. ? Plan to have a responsible adult care for you for at least 24 hours after you leave the hospital or clinic. This is important. What happens during the procedure?   You will be given anesthetics through both of the following: ? A mask  placed over your nose and mouth. ? An IV in one of your veins.  You may receive a medicine to help you relax (sedative).  After you are unconscious, a breathing tube may be inserted down your throat to help you breathe. This will be removed before you wake up.  An anesthesia specialist will stay with you throughout your procedure. He or she will: ? Keep you comfortable and safe by continuing to give you medicines and adjusting the amount of medicine that you get. ? Monitor your blood pressure, pulse, and oxygen levels to make sure that the anesthetics do not cause any problems. The procedure may vary among health care providers and hospitals. What happens after the procedure?  Your blood pressure, temperature, heart rate, breathing rate, and blood oxygen level will be monitored until the medicines you were given have worn off.  You will wake up in a recovery area. You may wake up slowly.  If you feel anxious or agitated, you may be given medicine to help you calm down.  If you will be going home the same day, your health care provider may check  to make sure you can walk, drink, and urinate.  Your health care provider will treat any pain or side effects you have before you go home.  Do not drive for 24 hours if you were given a sedative. Summary  General anesthesia is used to keep you still and prevent pain during a procedure.  It is important to tell your health care provider about your medical history and any surgeries you have had, and previous experience with anesthesia.  Follow your health care provider's instructions about when to stop eating, drinking, or taking certain medicines before your procedure.  Plan to have someone take you home from the hospital or clinic. This information is not intended to replace advice given to you by your health care provider. Make sure you discuss any questions you have with your health care provider. Document Revised: 02/15/2018 Document  Reviewed: 05/14/2017 Elsevier Patient Education  [place].  Transurethral Resection of Bladder Tumor  Transurethral resection of a bladder tumor is the removal (resection) of a cancerous growth (tumor) on the inside wall of the bladder. The bladder is the organ that holds urine. The tumor is removed through the tube that carries urine out of the body (urethra). In a transurethral resection, a thin telescope with a light, a tiny camera, and an electric cutting edge (resectoscope) is passed through the urethra. In men, the opening of the urethra is at the end of the penis. In women, it is just above the opening of the vagina. Tell a health care provider about:  Any allergies you have.  All medicines you are taking, including vitamins, herbs, eye drops, creams, and over-the-counter medicines.  Any problems you or family members have had with anesthetic medicines.  Any blood disorders you have.  Any surgeries you have had.  Any medical conditions you have.  Any recent urinary tract infections you have had.  Whether you are pregnant or may be pregnant. What are the risks? Generally, this is a safe procedure. However, problems may occur, including:  Infection.  Bleeding.  Allergic reactions to medicines.  Damage to nearby structures or organs, such as: ? The urethra. ? The tubes that drain urine from the kidneys into the bladder (ureters).  Pain and burning during urination.  Difficulty urinating due to partial blockage of the urethra.  Inability to urinate (urinary retention). What happens before the procedure? Staying hydrated Follow instructions from your health care provider about hydration, which may include:  Up to 2 hours before the procedure - you may continue to drink clear liquids, such as water, clear fruit juice, black coffee, and plain tea.  Eating and drinking restrictions Follow instructions from your health care provider about eating and drinking,  which may include:  8 hours before the procedure - stop eating heavy meals or foods, such as meat, fried foods, or fatty foods.  6 hours before the procedure - stop eating light meals or foods, such as toast or cereal.  6 hours before the procedure - stop drinking milk or drinks that contain milk.  2 hours before the procedure - stop drinking clear liquids. Medicines Ask your health care provider about:  Changing or stopping your regular medicines. This is especially important if you are taking diabetes medicines or blood thinners.  Taking medicines such as aspirin and ibuprofen. These medicines can thin your blood. Do not take these medicines unless your health care provider tells you to take them.  Taking over-the-counter medicines, vitamins, herbs, and supplements. Tests You  may have exams or tests, including:  Physical exam.  Blood tests.  Urine tests.  Electrocardiogram (ECG). This test measures the electrical activity of the heart. General instructions  Plan to have someone take you home from the hospital or clinic.  Ask your health care provider how your surgical site will be marked or identified.  Ask your health care provider what steps will be taken to help prevent infection. These may include: ? Washing skin with a germ-killing soap. ? Taking antibiotic medicine. What happens during the procedure?  An IV will be inserted into one of your veins.  You will be given one or more of the following: ? A medicine to help you relax (sedative). ? A medicine to make you fall asleep (general anesthetic). ? A medicine that is injected into your spine to numb the area below and slightly above the injection site (spinal anesthetic).  Your legs will be placed in foot rests (stirrups) so that your legs are apart and your knees are bent.  The resectoscope will be passed through your urethra and into your bladder.  The part of your bladder that is affected by the tumor will  be resected using the cutting edge of the resectoscope.  The resectoscope will be removed.  A thin, flexible tube (catheter) will be passed through your urethra and into your bladder. The catheter will drain urine into a bag outside of your body. ? Fluid may be passed through the catheter to keep the catheter open. The procedure may vary among health care providers and hospitals. What happens after the procedure?  Your blood pressure, heart rate, breathing rate, and blood oxygen level will be monitored until you leave the hospital or clinic.  You may continue to receive fluids and medicines through an IV.  You will have some pain. You will be given pain medicine to relieve pain.  You will have a catheter to drain your urine. ? You will have blood in your urine. Your catheter may be kept in until your urine is clear. ? The amount of urine will be monitored. If necessary, your bladder may be rinsed out (irrigated) by passing fluid through your catheter.  You will be encouraged to walk around as soon as possible.  You may have to wear compression stockings. These stockings help to prevent blood clots and reduce swelling in your legs.  Do not drive for 24 hours if you were given a sedative during your procedure. Summary  Transurethral resection of a bladder tumor is the removal (resection) of a cancerous growth (tumor) on the inside wall of the bladder.  To do this procedure, your health care provider uses a thin telescope with a light, a tiny camera, and an electric cutting edge (resectoscope).  Follow your health care provider's instructions. You may need to stop or change certain medicines, and you may be told to stop eating and drinking several hours before the procedure.  Your blood pressure, heart rate, breathing rate, and blood oxygen level will be monitored until you leave the hospital or clinic.  You may have to wear compression stockings. These stockings help to prevent blood  clots and reduce swelling in your legs. This information is not intended to replace advice given to you by your health care provider. Make sure you discuss any questions you have with your health care provider. Document Revised: 04/29/2018 Document Reviewed: 04/29/2018 Elsevier Patient Education  [place].

## 2020-07-01 ENCOUNTER — Other Ambulatory Visit (HOSPITAL_COMMUNITY)
Admission: RE | Admit: 2020-07-01 | Discharge: 2020-07-01 | Disposition: A | Discharge status: Home / Self Care | Payer: Medicare FFS | Source: Ambulatory Visit | Attending: Urology | Admitting: Urology

## 2020-07-01 ENCOUNTER — Encounter (HOSPITAL_COMMUNITY): Payer: Self-pay

## 2020-07-01 ENCOUNTER — Other Ambulatory Visit: Payer: Self-pay

## 2020-07-01 ENCOUNTER — Encounter (HOSPITAL_COMMUNITY)
Admission: RE | Admit: 2020-07-01 | Discharge: 2020-07-01 | Disposition: A | Discharge status: Home / Self Care | Payer: Medicare FFS | Source: Ambulatory Visit | Attending: Urology | Admitting: Urology

## 2020-07-01 DIAGNOSIS — Z01818 Encounter for other preprocedural examination: Secondary | ICD-10-CM | POA: Insufficient documentation

## 2020-07-01 DIAGNOSIS — Z20822 Contact with and (suspected) exposure to covid-19: Secondary | ICD-10-CM | POA: Insufficient documentation

## 2020-07-01 DIAGNOSIS — Z01812 Encounter for preprocedural laboratory examination: Secondary | ICD-10-CM | POA: Insufficient documentation

## 2020-07-01 HISTORY — DX: Unspecified macular degeneration: H35.30

## 2020-07-01 HISTORY — DX: Unspecified hearing loss, unspecified ear: H91.90

## 2020-07-01 LAB — CBC WITH DIFFERENTIAL/PLATELET
Abs Immature Granulocytes: 0.03 10*3/uL (ref 0.00–0.07)
Basophils Absolute: 0.1 10*3/uL (ref 0.0–0.1)
Basophils Relative: 1 %
Eosinophils Absolute: 0.2 10*3/uL (ref 0.0–0.5)
Eosinophils Relative: 3 %
HCT: 40.7 % (ref 36.0–46.0)
Hemoglobin: 12.4 g/dL (ref 12.0–15.0)
Immature Granulocytes: 1 %
Lymphocytes Relative: 19 %
Lymphs Abs: 1.2 10*3/uL (ref 0.7–4.0)
MCH: 31.4 pg (ref 26.0–34.0)
MCHC: 30.5 g/dL (ref 30.0–36.0)
MCV: 103 fL — ABNORMAL HIGH (ref 80.0–100.0)
Monocytes Absolute: 0.5 10*3/uL (ref 0.1–1.0)
Monocytes Relative: 8 %
Neutro Abs: 4.2 10*3/uL (ref 1.7–7.7)
Neutrophils Relative %: 68 %
Platelets: 178 10*3/uL (ref 150–400)
RBC: 3.95 MIL/uL (ref 3.87–5.11)
RDW: 14.3 % (ref 11.5–15.5)
WBC: 6.1 10*3/uL (ref 4.0–10.5)
nRBC: 0 % (ref 0.0–0.2)

## 2020-07-01 LAB — BASIC METABOLIC PANEL
Anion gap: 9 (ref 5–15)
BUN: 16 mg/dL (ref 8–23)
CO2: 32 mmol/L (ref 22–32)
Calcium: 8.4 mg/dL — ABNORMAL LOW (ref 8.9–10.3)
Chloride: 94 mmol/L — ABNORMAL LOW (ref 98–111)
Creatinine, Ser: 0.71 mg/dL (ref 0.44–1.00)
GFR calc Af Amer: >60 mL/min (ref >60)
GFR calc non Af Amer: >60 mL/min (ref >60)
Glucose, Bld: 140 mg/dL — ABNORMAL HIGH (ref 70–99)
Potassium: 3.6 mmol/L (ref 3.5–5.1)
Sodium: 135 mmol/L (ref 135–145)

## 2020-07-01 LAB — SARS CORONAVIRUS 2 (TAT 6-24 HRS): SARS Coronavirus 2: NEGATIVE

## 2020-07-01 NOTE — Pre-Procedure Instructions (Signed)
Spoke with [facility] @ Dr Ruel Favors office. Dr Alyson Ingles does not want patient to stop eliquis. We have note for medical clearance and Dr Alyson Ingles is aware that patient is @ high risk for procedure. Dr Charna Elizabeth also aware of this.

## 2020-07-02 ENCOUNTER — Ambulatory Visit (HOSPITAL_COMMUNITY)
Admission: RE | Admit: 2020-07-02 | Discharge: 2020-07-02 | Disposition: A | Discharge status: Home / Self Care | Payer: Medicare FFS | Attending: Urology | Admitting: Urology

## 2020-07-02 ENCOUNTER — Other Ambulatory Visit: Payer: Self-pay

## 2020-07-02 ENCOUNTER — Encounter (HOSPITAL_COMMUNITY): Payer: Self-pay | Admitting: Urology

## 2020-07-02 ENCOUNTER — Encounter (HOSPITAL_COMMUNITY)
Admission: RE | Disposition: A | Discharge status: Home / Self Care | Payer: Self-pay | Source: Home / Self Care | Attending: Urology

## 2020-07-02 DIAGNOSIS — D494 Neoplasm of unspecified behavior of bladder: Secondary | ICD-10-CM | POA: Insufficient documentation

## 2020-07-02 DIAGNOSIS — Z01818 Encounter for other preprocedural examination: Secondary | ICD-10-CM | POA: Diagnosis not present

## 2020-07-02 DIAGNOSIS — Z01812 Encounter for preprocedural laboratory examination: Secondary | ICD-10-CM | POA: Insufficient documentation

## 2020-07-02 DIAGNOSIS — R310 Gross hematuria: Secondary | ICD-10-CM

## 2020-07-02 SURGERY — CYSTOSCOPY, WITH RETROGRADE PYELOGRAM
Anesthesia: General

## 2020-07-02 MED ORDER — CHLORHEXIDINE GLUCONATE 0.12 % MT SOLN: 15.0000 mL | Freq: Once | OROMUCOSAL | Status: DC

## 2020-07-02 MED ORDER — LACTATED RINGERS IV SOLN: Freq: Once | INTRAVENOUS | Status: DC

## 2020-07-02 MED ORDER — CEFAZOLIN SODIUM-DEXTROSE 2-4 GM/100ML-% IV SOLN
2.0000 g | INTRAVENOUS | Status: DC
  Filled 2020-07-02: qty 100

## 2020-07-02 MED ORDER — ORAL CARE MOUTH RINSE: 15.0000 mL | Freq: Once | OROMUCOSAL | Status: DC

## 2020-07-02 NOTE — Progress Notes (Signed)
Anesthesia is requesting an ECHO per Dr. Alyson Ingles. Request sent to cardiology.

## 2020-07-02 NOTE — Progress Notes (Signed)
Pt advised to postpone surgery today from anesthesiologist and surgeon and to get cardiac clearance. Appointment made for pt to see cardiologist and cardiology NP, Katina Dung on Oct. 4, 2021 at 2:30 at the [facility] office. Pt and niece verbalized understanding.

## 2020-07-03 DIAGNOSIS — Z01818 Encounter for other preprocedural examination: Secondary | ICD-10-CM | POA: Diagnosis not present

## 2020-07-08 ENCOUNTER — Ambulatory Visit: Payer: Medicare FFS | Admitting: Urology

## 2020-07-12 ENCOUNTER — Telehealth: Payer: Self-pay | Admitting: Internal Medicine

## 2020-07-12 NOTE — Telephone Encounter (Signed)
Called patient to remind of appt with Levell July NP on Mon 07/15/20 at 2:30pm.  She said that she does not need clearance (procedure has been done) just followup.

## 2020-07-15 ENCOUNTER — Ambulatory Visit (INDEPENDENT_AMBULATORY_CARE_PROVIDER_SITE_OTHER): Payer: Medicare FFS | Admitting: Family Medicine

## 2020-07-15 ENCOUNTER — Encounter: Payer: Self-pay | Admitting: Family Medicine

## 2020-07-15 VITALS — BP 104/80 | HR 63 | Ht 64.0 in | Wt 122.0 lb

## 2020-07-15 DIAGNOSIS — I10 Essential (primary) hypertension: Secondary | ICD-10-CM

## 2020-07-15 DIAGNOSIS — I447 Left bundle-branch block, unspecified: Secondary | ICD-10-CM

## 2020-07-15 DIAGNOSIS — Z01818 Encounter for other preprocedural examination: Secondary | ICD-10-CM

## 2020-07-15 DIAGNOSIS — I5022 Chronic systolic (congestive) heart failure: Secondary | ICD-10-CM

## 2020-07-15 DIAGNOSIS — I4892 Unspecified atrial flutter: Secondary | ICD-10-CM | POA: Diagnosis not present

## 2020-07-15 NOTE — Patient Instructions (Addendum)
Your physician recommends that you schedule a follow-up appointment in: [place] Your physician recommends that you continue on your current medications as directed. Please refer to the Current Medication list given to you today.  YOU CAN STOP ELIQUIS 5 DAYS PRIOR TO PROCEDURE AND RESTART AS SURGEONS DISCRETION   Thank you for choosing [facility]!!

## 2020-07-15 NOTE — Progress Notes (Addendum)
Cardiology Office Note  Date: 07/15/2020   ID: [name], DOB 1917/01/19, MRN [id number]  PCP:  Sarah Noble, MD  Cardiologist:  No primary care provider on file. Electrophysiologist:  None   Chief Complaint: Chronic systolic heart failure and atrial flutter  History of Present Illness: [name] is a 86 y.o. female with a history of chronic systolic heart failure and atrial flutter.  Advanced COPD and pulmonary fibrosis.  Last encounter with Dr. Bronson Briggs 12/14/2019 [place]  Her nephew is Dr. Asencion Briggs and he was checking on her regularly and helping take care of the farm.  She denied any chest pain, shortness of breath, palpitations or leg swelling.   Her weight has been relatively stable.  She remained on Lasix 40 mg daily along with metoprolol and losartan.  No ischemia by stress testing 2016.  LVEF 35% by echo in 2016.  Had restrictive diastolic physiology.  Was continuing on Eliquis 2.5 mg p.o. twice daily for systemic anticoagulation.  Chronic LBBB, remained on losartan and Lopressor for blood pressure control.  This is a pleasant 86 year old female who was referred to [place] by Dr. Alyson Briggs for preop clearance for cystoscopy.  Has a history of chronic systolic heart failure and atrial letter as well as advanced COPD and pulmonary fibrosis.  She presents today with no particular complaints other than feeling fatigued.  She is not very active on a daily basis.  She is on continuous O2.  Blood pressures well controlled with today's blood pressure 104/80 with a heart rate of 63.  O2 saturation on O2 is 90%.  She is here with a caregiver.  She states Dr. Alyson Briggs had seen something in her bladder that he was worried about and wanted to do cystoscopy to further evaluate.  She states she been having some blood in her urine secondary.  She denies any previous bleeding on Eliquis in the past.  She had a CT of the abdomen pelvis 05/03/2020 due to urinary  frequency and hematuria.  Evidence of bilateral renal cysts.  The largest in the right lower pole measuring 3.3 cm.  No hydronephrosis, adrenal glands and urinary bladder grossly unremarkable urinary bladder decompressed. Recent CBC on 07/01/2020 was unremarkable.  Glucose was 140, calcium was 8.4.  She is on apixaban 2.5 mg p.o. twice daily for atrial flutter.  Past Medical History:  Diagnosis Date  . Cancer (HCC)    Lt Breast, Skin  . Fluttering heart   . HOH (hard of hearing)   . Hypertension   . Macular degeneration   . Pancreatitis, acute     Past Surgical History:  Procedure Laterality Date  . BREAST SURGERY    . CHOLECYSTECTOMY    . TONSILLECTOMY      Current Outpatient Medications  Medication Sig Dispense Refill  . beta carotene w/minerals (OCUVITE) tablet Take 1 tablet by mouth daily.     Marland Kitchen ELIQUIS 2.5 MG TABS tablet Take 2.5 mg by mouth 2 (two) times daily.     . furosemide (LASIX) 40 MG tablet TAKE 1 TABLET EVERY DAY (Patient taking differently: Take 40 mg by mouth daily. ) 90 tablet 1  . gabapentin (NEURONTIN) 100 MG capsule Take 200 mg by mouth 2 (two) times daily.    Marland Kitchen losartan (COZAAR) 50 MG tablet Take 50 mg by mouth daily.     . metoprolol tartrate (LOPRESSOR) 50 MG tablet TAKE 1 AND 1/2 TABLETS TWICE DAILY (Patient taking  differently: Take 75 mg by mouth 2 (two) times daily. ) 270 tablet 3  . mirtazapine (REMERON) 7.5 MG tablet Take 7.5 mg by mouth at bedtime.    . potassium chloride SA (K-DUR,KLOR-CON) 20 MEQ tablet Take 20 mEq by mouth daily.      Current Facility-Administered Medications  Medication Dose Route Frequency Provider Last Rate Last Admin  . ciprofloxacin (CIPRO) tablet 500 mg  500 mg Oral Once McKenzie, Candee Furbish, MD       Allergies:  Patient has no known allergies.   Social History: The patient  reports that she quit smoking about 37 years ago. Her smoking use included cigarettes. She started smoking about 77 years ago. She has a 20.00 pack-year  smoking history. She has never used smokeless tobacco. She reports current alcohol use of about 9.0 standard drinks of alcohol per week. She reports that she does not use drugs.   Family History: The patient's family history includes Cancer in her paternal aunt; Heart attack in her paternal grandmother; Heart disease in her paternal grandmother; Heart failure in her paternal grandmother; Hypertension in her mother.   ROS:  Please see the history of present illness. Otherwise, complete review of systems is positive for none.  All other systems are reviewed and negative.   Physical Exam: VS:  BP 104/80   Pulse 63   Ht 5\' 4"  (1.626 m)   Wt 122 lb (55.3 kg)   SpO2 90%   BMI 20.94 kg/m , BMI Body mass index is 20.94 kg/m.  Wt Readings from Last 3 Encounters:  07/15/20 122 lb (55.3 kg)  07/02/20 121 lb 14.6 oz (55.3 kg)  07/01/20 122 lb (55.3 kg)    General: Patient appears comfortable at rest. HEENT: Conjunctiva and lids normal, oropharynx clear with moist mucosa. Neck: Supple, no elevated JVP or carotid bruits, no thyromegaly. Lungs: Clear to auscultation, nonlabored breathing at rest. Cardiac: Regular rate and rhythm, no S3 or significant systolic murmur, no pericardial rub. Abdomen: Soft, nontender, no hepatomegaly, bowel sounds present, no guarding or rebound. Extremities: No pitting edema, distal pulses 2+. Skin: Warm and dry. Musculoskeletal: No kyphosis. Neuropsychiatric: Alert and oriented x3, affect grossly appropriate.  ECG:  EKG on July 01, 2020 showed atrial fibrillation with slow ventricular response rate of 57, nonspecific intraventricular conduction block.  Recent Labwork: 07/01/2020: BUN 16; Creatinine, Ser 0.71; Hemoglobin 12.4; Platelets 178; Potassium 3.6; Sodium 135  No results found for: CHOL, TRIG, HDL, CHOLHDL, VLDL, LDLCALC, LDLDIRECT  Other Studies Reviewed Today:  Echocardiogram 08/30/2015:  Left ventricle: The cavity size was normal. Wall  thickness was  increased in a pattern of mild LVH. Systolic function was  severely reduced. The estimated ejection fraction was  approximately 30%. Doppler parameters are consistent with  restrictive physiology, indicative of decreased left ventricular  diastolic compliance and/or increased left atrial pressure.  Doppler parameters are consistent with high ventricular filling  pressure.  - Ventricular septum: Septal motion showed abnormal function and  dyssynergy. These changes are consistent with a left bundle  branch block.  - Aortic valve: Mildly calcified annulus. Trileaflet; mildly  thickened leaflets.  - Mitral valve: Mildly thickened leaflets . There was mild  regurgitation.  - Left atrium: The atrium was moderately dilated.  - Right ventricle: Systolic function was mildly reduced.  - Right atrium: The atrium was moderately dilated.  - Atrial septum: The septum bowed from left to right, consistent  with increased left atrial pressure.  - Tricuspid valve: There was mild  regurgitation.  - Pulmonic valve: There was mild regurgitation.  - Pulmonary arteries: Systolic pressure was mild to moderately  increased. PA peak pressure: 44 mm Hg (S).  - Systemic veins: Dilated IVC with normal respiratory variation.  Estimated CVP 8 mmHg.  - Pericardium, extracardiac: A trivial pericardial effusion was  identified.   Assessment and Plan:  1. Preoperative clearance   2. Chronic systolic heart failure ([place])   3. Chronic atrial flutter ([place])   4. LBBB (left bundle branch block)   5. Essential hypertension    1. Preoperative clearance Patient having recent episodes of hematuria.  Patient states Dr. Alyson Briggs wants to perform a cystoscopy on her to evaluate source of hematuria.  Revised cardiac risk index shows total points of 1 which puts the patient at a perioperative risk of major cardiac event at 0.9%.  However given advanced age of 26.  She may be  considered low to moderate moderate risk from a cardiac standpoint based on age alone combined with other risk factors including chronic systolic heart failure, chronic atrial flutter, COPD.  May stop apixaban 2.5 mg p.o. twice daily 5 days prior to procedure and resume at surgeon's discretion after procedure.  2. Chronic systolic heart failure (HCC) History of chronic systolic heart failure with last echo demonstrating EF approximately 30%.  Doppler parameters consistent with restrictive physiology..  Ventricular septum septal motion showed abnormal function and dyssynergy.  Consistent with left bundle branch block.  Tricuspid valve mild regurgitation mitral valve mild regurgitation pulmonic valve mild regurgitation PASP mild to moderately increased with PA peak pressure of 44 mmHg.  Continue Lasix daily.  3. Chronic atrial flutter (HCC) Chronic atrial flutter with rate controlled today at 63.  Continues on Eliquis 2.5 mg p.o. twice daily as well as 75 mg of metoprolol p.o. twice daily.  4. LBBB (left bundle branch block) EKG on July 01, 2020 showed atrial fibrillation with slow ventricular response rate of 57, nonspecific intraventricular conduction block.  5. Essential hypertension Blood pressure well controlled on current therapy.  Continue losartan 50 mg daily.  Continue metoprolol 75 mg p.o. twice daily.   Medication Adjustments/Labs and Tests Ordered: Current medicines are reviewed at length with the patient today.  Concerns regarding medicines are outlined above.   Disposition: Follow-up with Dr. Domenic Polite 6 months at [facility] office  Signed, Levell July, NP 07/15/2020 3:20 PM    [facility] [place], [place], [place] [address] Phone: [phone]; Fax: [phone]

## 2020-08-26 ENCOUNTER — Institutional Professional Consult (permissible substitution): Payer: Medicare FFS | Admitting: Internal Medicine

## 2020-08-30 ENCOUNTER — Other Ambulatory Visit (HOSPITAL_COMMUNITY)
Admission: RE | Admit: 2020-08-30 | Discharge: 2020-08-30 | Disposition: A | Discharge status: Home / Self Care | Payer: Medicare FFS | Source: Ambulatory Visit | Attending: Internal Medicine | Admitting: Internal Medicine

## 2020-08-30 ENCOUNTER — Other Ambulatory Visit: Payer: Self-pay

## 2020-08-30 ENCOUNTER — Encounter: Payer: Self-pay | Admitting: Internal Medicine

## 2020-08-30 ENCOUNTER — Ambulatory Visit: Payer: Medicare FFS | Admitting: Internal Medicine

## 2020-08-30 ENCOUNTER — Ambulatory Visit (HOSPITAL_COMMUNITY)
Admission: RE | Admit: 2020-08-30 | Discharge: 2020-08-30 | Disposition: A | Discharge status: Home / Self Care | Payer: Medicare FFS | Source: Ambulatory Visit | Attending: Internal Medicine | Admitting: Internal Medicine

## 2020-08-30 DIAGNOSIS — R0600 Dyspnea, unspecified: Secondary | ICD-10-CM | POA: Insufficient documentation

## 2020-08-30 DIAGNOSIS — J9611 Chronic respiratory failure with hypoxia: Secondary | ICD-10-CM | POA: Diagnosis not present

## 2020-08-30 DIAGNOSIS — J9612 Chronic respiratory failure with hypercapnia: Secondary | ICD-10-CM | POA: Diagnosis not present

## 2020-08-30 DIAGNOSIS — I4892 Unspecified atrial flutter: Secondary | ICD-10-CM

## 2020-08-30 DIAGNOSIS — R0609 Other forms of dyspnea: Secondary | ICD-10-CM

## 2020-08-30 LAB — CBC WITH DIFFERENTIAL/PLATELET
Abs Immature Granulocytes: 0.02 10*3/uL (ref 0.00–0.07)
Basophils Absolute: 0.1 10*3/uL (ref 0.0–0.1)
Basophils Relative: 1 %
Eosinophils Absolute: 0.4 10*3/uL (ref 0.0–0.5)
Eosinophils Relative: 6 %
HCT: 39.6 % (ref 36.0–46.0)
Hemoglobin: 12.3 g/dL (ref 12.0–15.0)
Immature Granulocytes: 0 %
Lymphocytes Relative: 15 %
Lymphs Abs: 0.9 10*3/uL (ref 0.7–4.0)
MCH: 31.3 pg (ref 26.0–34.0)
MCHC: 31.1 g/dL (ref 30.0–36.0)
MCV: 100.8 fL — ABNORMAL HIGH (ref 80.0–100.0)
Monocytes Absolute: 0.7 10*3/uL (ref 0.1–1.0)
Monocytes Relative: 11 %
Neutro Abs: 4.3 10*3/uL (ref 1.7–7.7)
Neutrophils Relative %: 67 %
Platelets: 182 10*3/uL (ref 150–400)
RBC: 3.93 MIL/uL (ref 3.87–5.11)
RDW: 14.1 % (ref 11.5–15.5)
WBC: 6.4 10*3/uL (ref 4.0–10.5)
nRBC: 0 % (ref 0.0–0.2)

## 2020-08-30 LAB — BRAIN NATRIURETIC PEPTIDE: B Natriuretic Peptide: 534 pg/mL — ABNORMAL HIGH (ref 0.0–100.0)

## 2020-08-30 LAB — BASIC METABOLIC PANEL
Anion gap: 6 (ref 5–15)
BUN: 14 mg/dL (ref 8–23)
CO2: 36 mmol/L — ABNORMAL HIGH (ref 22–32)
Calcium: 8.7 mg/dL — ABNORMAL LOW (ref 8.9–10.3)
Chloride: 93 mmol/L — ABNORMAL LOW (ref 98–111)
Creatinine, Ser: 0.71 mg/dL (ref 0.44–1.00)
GFR, Estimated: >60 mL/min (ref >60)
Glucose, Bld: 92 mg/dL (ref 70–99)
Potassium: 4.9 mmol/L (ref 3.5–5.1)
Sodium: 135 mmol/L (ref 135–145)

## 2020-08-30 LAB — D-DIMER, QUANTITATIVE: D-Dimer, Quant: 0.75 ug/mL-FEU — ABNORMAL HIGH (ref 0.00–0.50)

## 2020-08-30 LAB — TSH: TSH: 2.041 u[IU]/mL (ref 0.350–4.500)

## 2020-08-30 LAB — SEDIMENTATION RATE: Sed Rate: 10 mm/hr (ref 0–22)

## 2020-08-30 NOTE — Patient Instructions (Addendum)
Please remember to go to the lab and x-ray department at [facility]   for your tests - we will call you with the results when they are available.      Make sure you check your oxygen saturations at highest level of activity to be sure it stays over 90% and adjust  02 flow upward to maintain this level if needed but remember to turn it back to previous settings when you stop (to conserve your supply).   You will need re-evaluation by your cardiologist also before you can be cleared for surgery  Add:  Note the afib/flutter is chronic

## 2020-08-30 NOTE — Progress Notes (Signed)
[name], female    DOB: [birth year],    MRN: [id number]   Brief patient profile:  86 yowf quit smoking 1983 due pneumonia s any residual symptoms of doe or cough and says " fine"  until around 2018 with 02 dep resp failure ? Etiology and req for surgical clearance for possible cystoscopic removal of bladder tumor referred to pulmonary clinic in [place]  08/30/2020 by Dr    Willey Blade    Note:  Dr Ria Comment office notes were received  in [place] office but not reviewed prior to this note being note being generated remotely  and will be faxed to [facility] office for review and addendum to this note if needed.     History of Present Illness  08/30/2020  Pulmonary/ 1st office eval/ [facility] / [place] Office  Chief Complaint  Patient presents with  . Consult    occasional non productive cough & shortness of breath with activity  Dyspnea:  25 ft to bathroom much better with 02 than s but doesn't check sats  Cough: minimal dry  Sleep: maybe 30 degrees x years  SABA use: none 02 2lpm  May be 2 years  Does not think she has seen urologist though records indicate it is McKenzie   No obvious day to day or daytime variability or assoc excess/ purulent sputum or mucus plugs or hemoptysis or cp or chest tightness, subjective wheeze or overt sinus or hb symptoms.   Sleeping as above without nocturnal  or early am exacerbation  of respiratory  c/o's or need for noct saba. Also denies any obvious fluctuation of symptoms with weather or environmental changes or other aggravating or alleviating factors except as outlined above   No unusual exposure hx or h/o childhood pna/ asthma or knowledge of premature birth.  Current Allergies, Complete Past Medical History, Past Surgical History, Family History, and Social History were reviewed in [employer] record.  ROS  The following are not active complaints unless bolded Hoarseness, sore throat, dysphagia, dental problems,  itching, sneezing,  nasal congestion or discharge of excess mucus or purulent secretions, ear ache,   fever, chills, sweats, unintended wt loss or wt gain, classically pleuritic or exertional cp,  orthopnea pnd or arm/hand swelling  or leg swelling, presyncope, palpitations, abdominal pain, anorexia, nausea, vomiting, diarrhea  or change in bowel habits or change in bladder habits, change in stools or change in urine, dysuria, hematuria,  rash, arthralgias, visual complaints, headache, numbness, weakness or ataxia or problems with walking or coordination,  change in mood or  memory.             Past Medical History:  Diagnosis Date  . Cancer (HCC)    Lt Breast, Skin  . Fluttering heart   . HOH (hard of hearing)   . Hypertension   . Macular degeneration   . Pancreatitis, acute     Outpatient Medications Prior to Visit  Medication Sig Dispense Refill  . beta carotene w/minerals (OCUVITE) tablet Take 1 tablet by mouth daily.     Marland Kitchen ELIQUIS 2.5 MG TABS tablet Take 2.5 mg by mouth 2 (two) times daily.     . furosemide (LASIX) 40 MG tablet TAKE 1 TABLET EVERY DAY (Patient taking differently: Take 40 mg by mouth daily. ) 90 tablet 1  . gabapentin (NEURONTIN) 100 MG capsule Take 200 mg by mouth 2 (two) times daily.    Marland Kitchen losartan (COZAAR) 50 MG tablet Take 50 mg by mouth daily.     Marland Kitchen  metoprolol tartrate (LOPRESSOR) 50 MG tablet TAKE 1 AND 1/2 TABLETS TWICE DAILY (Patient taking differently: Take 75 mg by mouth 2 (two) times daily. ) 270 tablet 3  . potassium chloride SA (K-DUR,KLOR-CON) 20 MEQ tablet Take 20 mEq by mouth daily.     . mirtazapine (REMERON) 7.5 MG tablet Take 7.5 mg by mouth at bedtime. (Patient not taking: Reported on 08/30/2020)     Facility-Administered Medications Prior to Visit  Medication Dose Route Frequency Provider Last Rate Last Admin  . ciprofloxacin (CIPRO) tablet 500 mg  500 mg Oral Once McKenzie, Candee Furbish, MD         Objective:     BP 126/64 (BP Location: Left  Arm, Cuff Size: Normal)   Pulse (!) 54   Temp (!) 97.1 F (36.2 C) (Temporal)   Ht 5\' 2"  (1.575 m)   Wt 124 lb (56.2 kg)   SpO2 99% Comment: 2L O2  BMI 22.68 kg/m   SpO2: 99 % (2L O2) O2 Type: Continuous O2 O2 Flow Rate (L/min): 2 L/min   Frail but very pleasant elderly wf nad in w/c    HEENT : pt wearing mask not removed for exam due to covid -19 concerns.    NECK :  without JVD/Nodes/TM/ nl carotid upstrokes bilaterally   LUNGS: no acc muscle use,  Nl contour chest which is clear to A and P bilaterally without cough on insp or exp maneuvers   CV: IRIR around 50-60 apically   no s3 or murmur or increase in P2, and  R> L pitting ankle edema   ABD:  soft and nontender with nl inspiratory excursion in the supine position. No bruits or organomegaly appreciated, bowel sounds nl  MS:  Nl gait/ ext warm without deformities, calf tenderness, cyanosis or clubbing No obvious joint restrictions   SKIN: warm and dry without lesions    NEURO:  Alert but very poor grasp of present medical issues with  no motor or cerebellar deficits apparent.    ekg 08/30/2020  Appears to be in fib/flutter with rate 52   CXR PA and Lateral:   08/30/2020 :    I personally reviewed images and agree with radiology impression as follows:    Cardiomegaly, chronic scarring.  No active disease 12/07/17 My impression:  In lower lobes as seen on lateral view, fibrotic changes have worsened     I personally reviewed images and agree with radiology impression as follows:  CT chest 07/28/18 Changes of advanced emphysema and interstitial fibrosis are noted. These findings essentially stable from prior exams.  Labs ordered/ reviewed:      Chemistry      Component Value Date/Time   NA 135 08/30/2020 1228   K 4.9 08/30/2020 1228   CL 93 (L) 08/30/2020 1228   CO2 36 (H) 08/30/2020 1228   BUN 14 08/30/2020 1228   CREATININE 0.71 08/30/2020 1228      Component Value Date/Time   CALCIUM 8.7 (L)  08/30/2020 1228   ALKPHOS 81 05/04/2015 1020   AST 26 05/04/2015 1020   ALT 16 05/04/2015 1020   BILITOT 1.7 (H) 05/04/2015 1020        Lab Results  Component Value Date   WBC 6.4 08/30/2020   HGB 12.3 08/30/2020   HCT 39.6 08/30/2020   MCV 100.8 (H) 08/30/2020   PLT 182 08/30/2020     Lab Results  Component Value Date   DDIMER 0.75 (H) 08/30/2020      Lab Results  Component Value Date   TSH 2.041 08/30/2020      BNP     08/30/2020    =  534   Lab Results  Component Value Date   ESRSEDRATE 10 08/30/2020   ESRSEDRATE 33 (H) 03/23/2012          Assessment   DOE (dyspnea on exertion) Echo 08/30/15  - Left ventricle: The cavity size was normal. Wall thickness was  increased in a pattern of mild LVH. Systolic function was  severely reduced. The estimated ejection fraction was  approximately 30%. Doppler parameters are consistent with  restrictive physiology, indicative of decreased left ventricular  diastolic compliance and/or increased left atrial pressure.  Doppler parameters are consistent with high ventricular filling  pressure.  - Ventricular septum: Septal motion showed abnormal function and  dyssynergy. These changes are consistent with a left bundle  branch block.  - Aortic valve: Mildly calcified annulus. Trileaflet; mildly  thickened leaflets.  - Mitral valve: Mildly thickened leaflets . There was mild  regurgitation.  - Left atrium: The atrium was moderately dilated.  - Right ventricle: Systolic function was mildly reduced.  - Right atrium: The atrium was moderately dilated.  - Atrial septum: The septum bowed from left to right, consistent  with increased left atrial pressure.  - Tricuspid valve: There was mild regurgitation.  - Pulmonic valve: There was mild regurgitation.  - Pulmonary arteries: Systolic pressure was mild to moderately  increased. PA peak pressure: 44 mm Hg (S).  - Systemic veins: Dilated IVC with  normal respiratory variation.  Estimated CVP 8 mmHg.  - Pericardium, extracardiac: A trivial pericardial effusion was  identified.   Her doe is clearly multifactorial with chf/ WHO II vs III PH and this by itself would place her at significant risk for perioperative mortality but age 89 and general frailty / ? Cognitive impairment will put her at a risk level that will need to weighed against other options and if complications occur I would strongly recommend a contingency plan for limiting aggressive therapies (eg pressors, dialysis, ventilator) in the event that prognosis for a fully functional recovery declines as a result of those complications.   The PF component of her problem is the least significant as does not affect her cough mechanics, is relatively stable over the last year,  and she is easy to oxygenate at baseline but this too reduces her reserve in the event of acute complications ( sepsis, chf, need for sedation and analegesics and would call for early palliative care eval in the event things go badly (with no form of a "tune up" to offer as would be the case if this simply copd (the emphysema on ct is typical of this age and unlikely to respond to bronchodilators)              Chronic respiratory failure with hypoxia and hypercapnia ([place]) HC03     08/30/2020   = 36   Advised target for sats  = low 90s/ may require perioperative BIPAP and would minimize sedation/ maximize mobiliztion IS if pursue general anesthesia      Atrial flutter ([place]) Still appears to be in fib/ flutter as of 08/30/2020 at low ventricular response rate but well compensated at rest.      Very high level of complexity in this case because of  > two  chronic conditions /diagnoses requiring extra time for  H and P, chart review, counseling,   and generating customized AVS unique to this office  visit and charting.   Each maintenance medication was reviewed in detail including emphasizing most  importantly the difference between maintenance and prns and under what circumstances the prns are to be triggered using an action plan format where appropriate. Please see avs for details which were reviewed in writing by both me and my nurse and patient given a written copy highlighted where appropriate with yellow highlighter for the patient's continued care at home along with an updated version of their medications.  Patient was asked to maintain medication reconciliation by comparing this list to the actual medications being used at home and to contact this office right away if there is a conflict or discrepancy.        Christinia Gully, MD 08/30/2020

## 2020-09-02 ENCOUNTER — Encounter: Payer: Self-pay | Admitting: Internal Medicine

## 2020-09-02 DIAGNOSIS — J9611 Chronic respiratory failure with hypoxia: Secondary | ICD-10-CM | POA: Insufficient documentation

## 2020-09-02 DIAGNOSIS — J9612 Chronic respiratory failure with hypercapnia: Secondary | ICD-10-CM | POA: Insufficient documentation

## 2020-09-02 NOTE — Assessment & Plan Note (Addendum)
Still appears to be in fib/ flutter as of 08/30/2020 at low ventricular response rate but well compensated at rest.   Very high level of complexity in this case because of  > two  chronic conditions /diagnoses requiring extra time for  H and P, chart review, counseling,   and generating customized AVS unique to this office visit and charting.   Each maintenance medication was reviewed in detail including emphasizing most importantly the difference between maintenance and prns and under what circumstances the prns are to be triggered using an action plan format where appropriate. Please see avs for details which were reviewed in writing by both me and my nurse and patient given a written copy highlighted where appropriate with yellow highlighter for the patient's continued care at home along with an updated version of their medications.  Patient was asked to maintain medication reconciliation by comparing this list to the actual medications being used at home and to contact this office right away if there is a conflict or discrepancy.

## 2020-09-02 NOTE — Assessment & Plan Note (Signed)
HC03     08/30/2020   = 36   Advised target for sats  = low 90s/ may require perioperative BIPAP and would minimize sedation/ maximize mobiliztion IS if pursue general anesthesia

## 2020-09-02 NOTE — Assessment & Plan Note (Addendum)
Echo 08/30/15  - Left ventricle: The cavity size was normal. Wall thickness was  increased in a pattern of mild LVH. Systolic function was  severely reduced. The estimated ejection fraction was  approximately 30%. Doppler parameters are consistent with  restrictive physiology, indicative of decreased left ventricular  diastolic compliance and/or increased left atrial pressure.  Doppler parameters are consistent with high ventricular filling  pressure.  - Ventricular septum: Septal motion showed abnormal function and  dyssynergy. These changes are consistent with a left bundle  branch block.  - Aortic valve: Mildly calcified annulus. Trileaflet; mildly  thickened leaflets.  - Mitral valve: Mildly thickened leaflets . There was mild  regurgitation.  - Left atrium: The atrium was moderately dilated.  - Right ventricle: Systolic function was mildly reduced.  - Right atrium: The atrium was moderately dilated.  - Atrial septum: The septum bowed from left to right, consistent  with increased left atrial pressure.  - Tricuspid valve: There was mild regurgitation.  - Pulmonic valve: There was mild regurgitation.  - Pulmonary arteries: Systolic pressure was mild to moderately  increased. PA peak pressure: 44 mm Hg (S).  - Systemic veins: Dilated IVC with normal respiratory variation.  Estimated CVP 8 mmHg.  - Pericardium, extracardiac: A trivial pericardial effusion was  identified.   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 Cognitive impairment will put her at a risk level that will need to weighed against other options and if complications occur I would strongly recommend a contingency plan for limiting aggressive therapies (eg pressors, dialysis, ventilator) in the event that prognosis for a fully functional recovery declines  as a result of those complications.   The PF component of her problem is the least significant as does not affect her cough mechanics, is relatively stable over the last year,  and she is easy to oxygenate at baseline but this too reduces her reserve in the event of acute complications ( sepsis, chf, need for sedation and analegesics and would call for early palliative care eval in the event things go badly (with no form of a "tune up" to offer as would be the case if this simply copd (the emphysema on ct is typical of this age and unlikely to respond to bronchodilators)

## 2020-09-03 NOTE — Progress Notes (Signed)
Spoke with pt and notified of results per Dr. Wert. Pt verbalized understanding and denied any questions. 

## 2020-10-08 ENCOUNTER — Other Ambulatory Visit: Payer: Self-pay

## 2020-10-08 MED ORDER — FUROSEMIDE 40 MG PO TABS
40.0000 mg | ORAL_TABLET | Freq: Every day | ORAL | 1 refills | Status: DC
Start: 2020-10-08 — End: 2021-05-19

## 2020-10-08 NOTE — Telephone Encounter (Signed)
Refilled lasix to [facility]

## 2021-05-19 ENCOUNTER — Other Ambulatory Visit: Payer: Self-pay | Admitting: Family Medicine

## 2021-08-12 DEATH — deceased
